# Patient Record
Sex: Female | Born: 1945
Health system: Southern US, Community
[De-identification: ages and names within clinical notes are randomized; demographics above are authoritative.]

## PROBLEM LIST (undated history)

## (undated) DIAGNOSIS — E079 Disorder of thyroid, unspecified: Secondary | ICD-10-CM

## (undated) DIAGNOSIS — E785 Hyperlipidemia, unspecified: Secondary | ICD-10-CM

## (undated) HISTORY — PX: APPENDECTOMY: SHX54

## (undated) HISTORY — DX: Hyperlipidemia, unspecified: E78.5

## (undated) HISTORY — DX: Disorder of thyroid, unspecified: E07.9

---

## 2004-10-20 ENCOUNTER — Ambulatory Visit (HOSPITAL_COMMUNITY): Admission: RE | Admit: 2004-10-20 | Discharge: 2004-10-20 | Payer: Self-pay | Admitting: Internal Medicine

## 2005-02-06 ENCOUNTER — Encounter: Admission: RE | Admit: 2005-02-06 | Discharge: 2005-05-07 | Payer: Self-pay | Admitting: Internal Medicine

## 2005-12-21 ENCOUNTER — Encounter: Admission: RE | Admit: 2005-12-21 | Discharge: 2005-12-21 | Payer: Self-pay | Admitting: Internal Medicine

## 2009-01-15 ENCOUNTER — Encounter: Admission: RE | Admit: 2009-01-15 | Discharge: 2009-01-15 | Payer: Self-pay | Admitting: Internal Medicine

## 2009-05-05 ENCOUNTER — Encounter: Admission: RE | Admit: 2009-05-05 | Discharge: 2009-05-05 | Payer: Self-pay | Admitting: Internal Medicine

## 2009-11-09 ENCOUNTER — Other Ambulatory Visit: Admission: RE | Admit: 2009-11-09 | Discharge: 2009-11-09 | Payer: Self-pay | Admitting: Internal Medicine

## 2009-11-09 ENCOUNTER — Ambulatory Visit: Payer: Self-pay | Admitting: Internal Medicine

## 2009-11-09 LAB — HM PAP SMEAR

## 2010-06-21 ENCOUNTER — Ambulatory Visit: Payer: Self-pay | Admitting: Internal Medicine

## 2011-01-11 IMAGING — CR DG SACRUM/COCCYX 2+V
4 series · 4 of 4 positions shown · non-contrast
Comparison: None.

CLINICAL DATA: Fall.  Low back and left hip pain.

SACRUM AND COCCYX - 2+ VIEW

[view not recorded (1 of 4)]
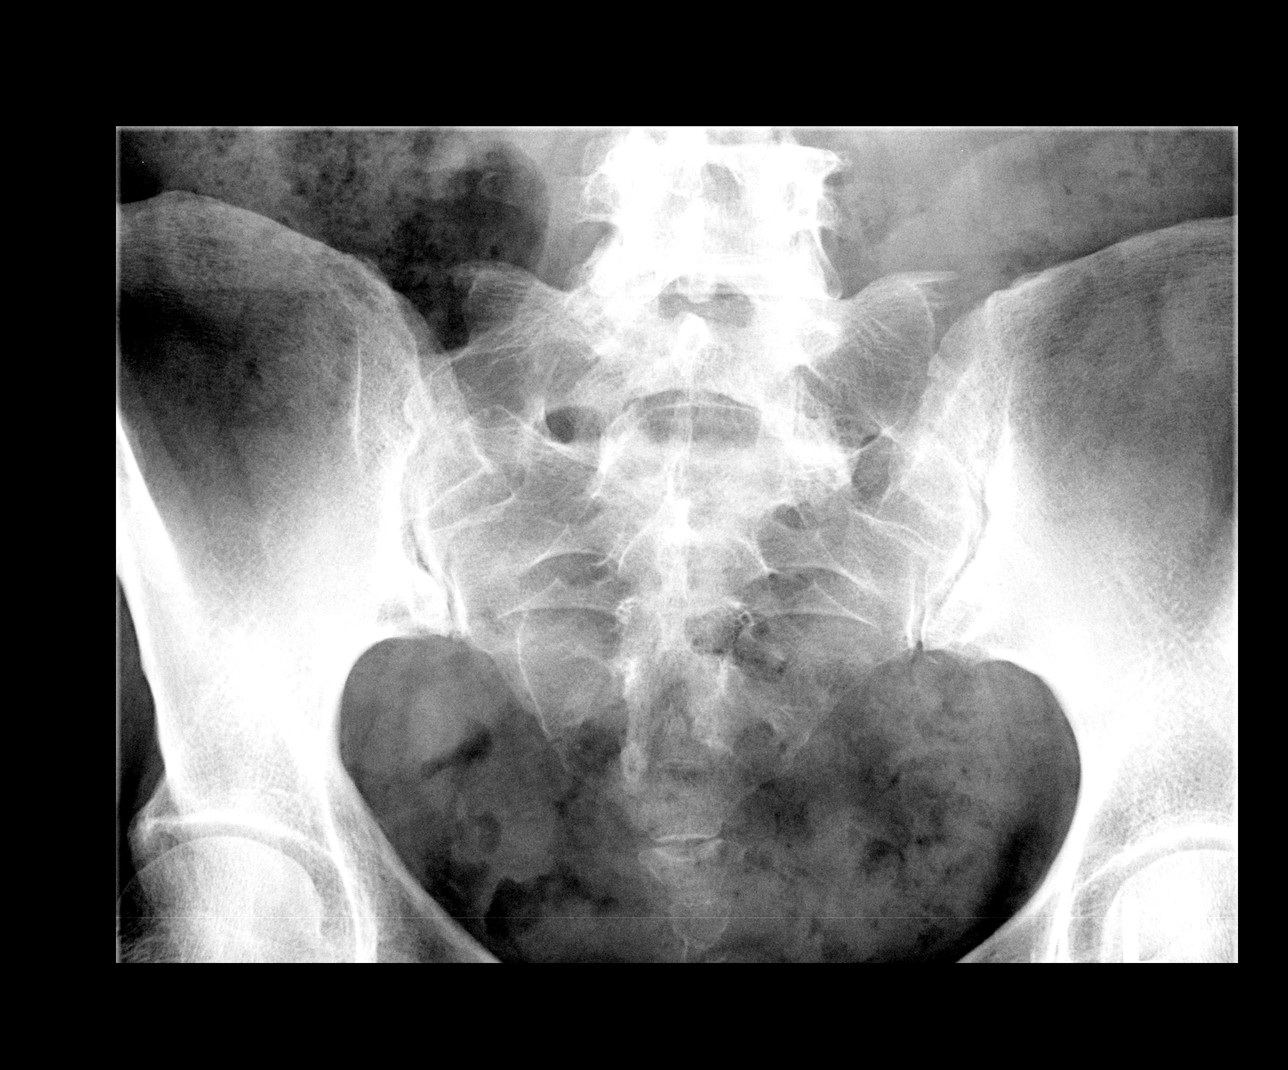

[view not recorded (2 of 4)]
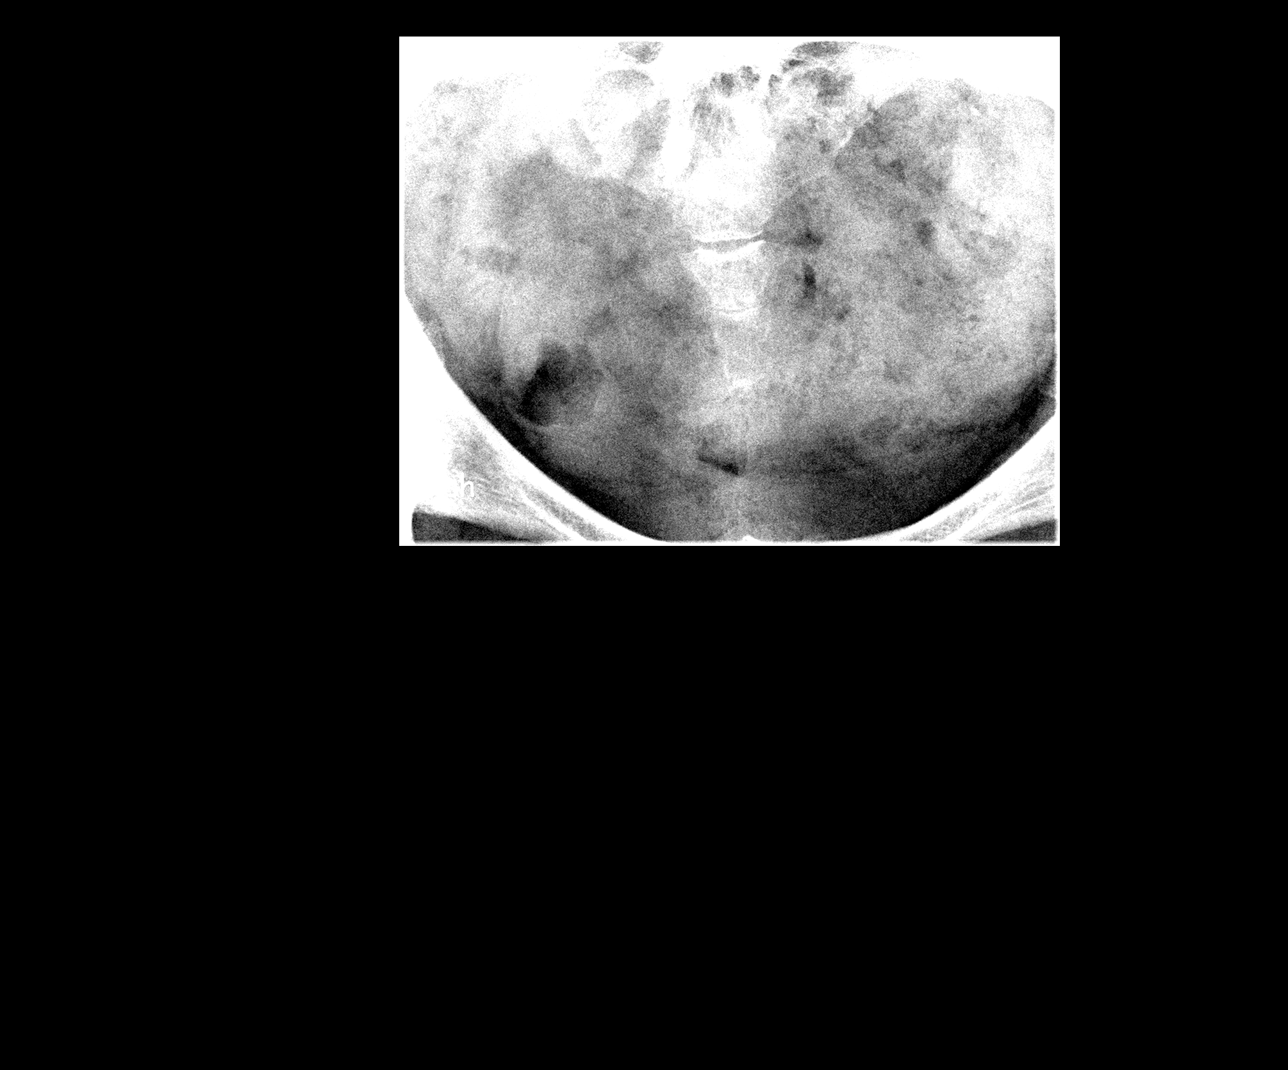

[view not recorded (3 of 4)]
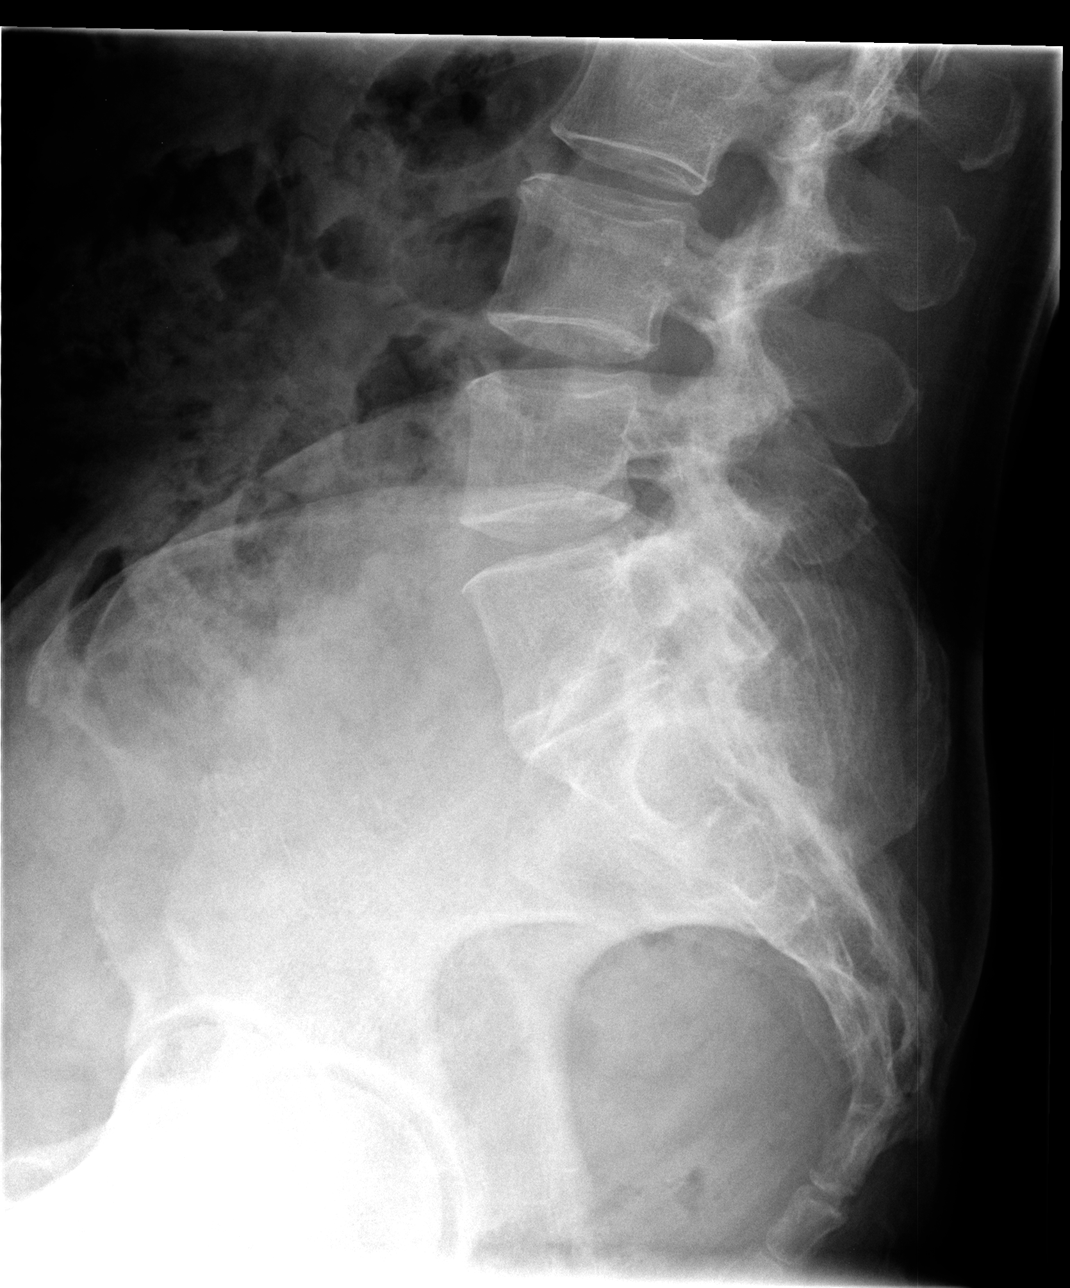

[view not recorded (4 of 4)]
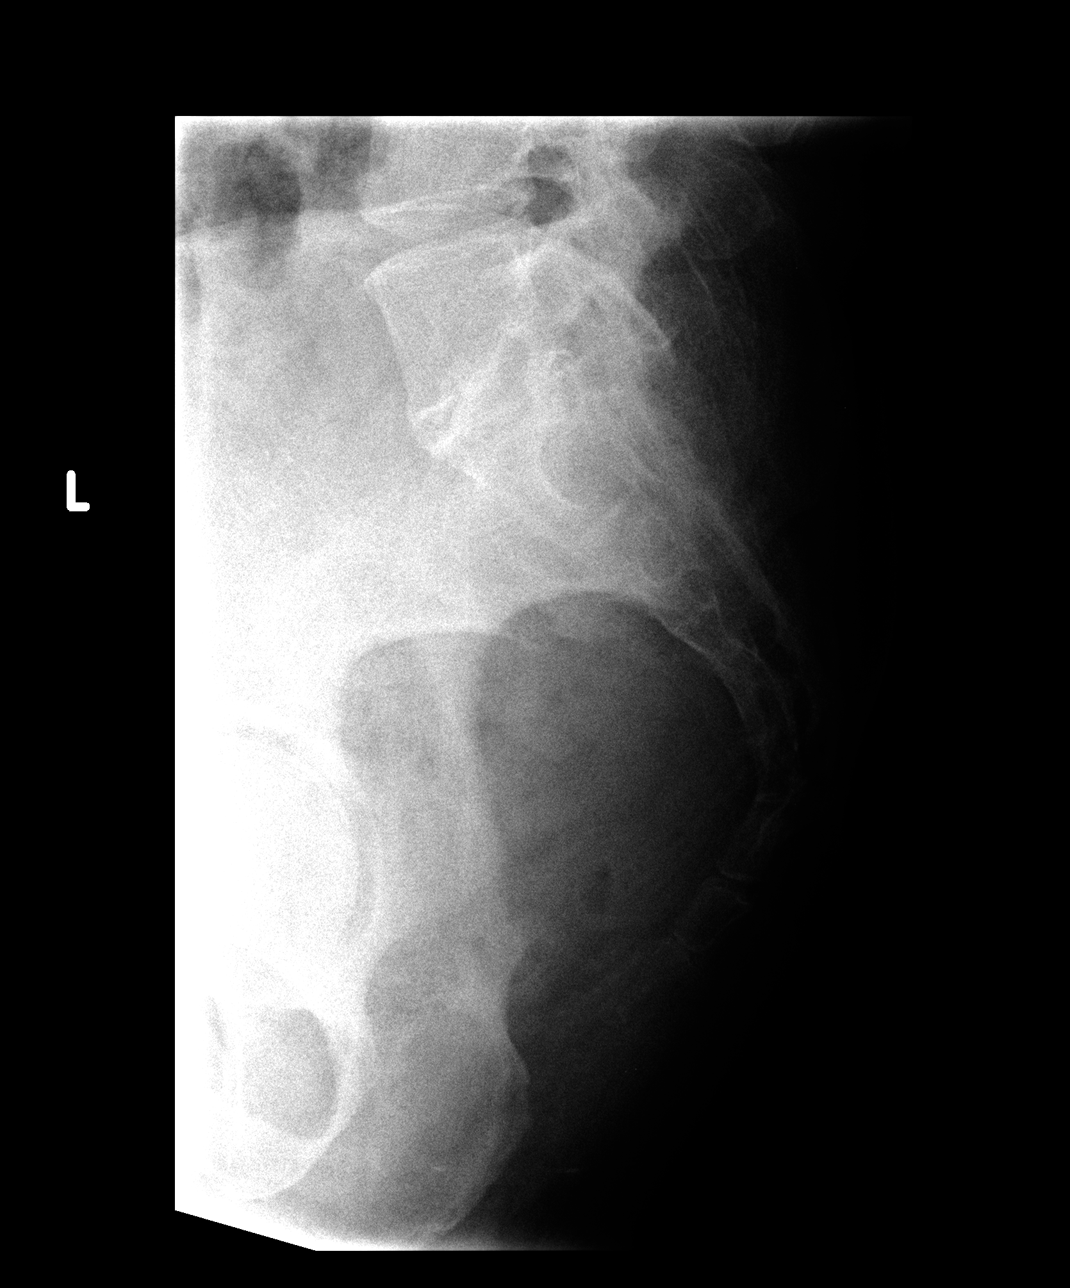

[4 of 4 positions shown; findings below may reference images not displayed]

FINDINGS: No evidence for acute fracture of the sacrum.  Arcuate
lines are preserved.  The SI joints are normal.
IMPRESSION: No acute bony abnormality.

## 2011-02-18 LAB — HM MAMMOGRAPHY

## 2011-03-29 ENCOUNTER — Other Ambulatory Visit: Payer: Self-pay | Admitting: Internal Medicine

## 2011-06-02 ENCOUNTER — Other Ambulatory Visit: Payer: Self-pay | Admitting: Internal Medicine

## 2011-06-30 ENCOUNTER — Encounter: Payer: Self-pay | Admitting: Internal Medicine

## 2011-07-06 ENCOUNTER — Ambulatory Visit (INDEPENDENT_AMBULATORY_CARE_PROVIDER_SITE_OTHER): Payer: BC Managed Care – PPO | Admitting: Internal Medicine

## 2011-07-06 ENCOUNTER — Encounter: Payer: Self-pay | Admitting: Internal Medicine

## 2011-07-06 ENCOUNTER — Other Ambulatory Visit: Payer: BC Managed Care – PPO | Admitting: Internal Medicine

## 2011-07-06 VITALS — BP 128/70 | HR 74 | Temp 99.0°F | Ht 62.5 in | Wt 150.0 lb

## 2011-07-06 DIAGNOSIS — Z0000 Encounter for general adult medical examination without abnormal findings: Secondary | ICD-10-CM

## 2011-07-06 DIAGNOSIS — E785 Hyperlipidemia, unspecified: Secondary | ICD-10-CM

## 2011-07-06 DIAGNOSIS — E119 Type 2 diabetes mellitus without complications: Secondary | ICD-10-CM

## 2011-07-06 LAB — CBC WITH DIFFERENTIAL/PLATELET
Basophils Absolute: 0 10*3/uL (ref 0.0–0.1)
Basophils Relative: 0 % (ref 0–1)
Eosinophils Absolute: 0.2 10*3/uL (ref 0.0–0.7)
HCT: 41.9 % (ref 36.0–46.0)
Hemoglobin: 13.4 g/dL (ref 12.0–15.0)
Lymphs Abs: 2.4 10*3/uL (ref 0.7–4.0)
MCH: 30.6 pg (ref 26.0–34.0)
MCHC: 32 g/dL (ref 30.0–36.0)
MCV: 95.7 fL (ref 78.0–100.0)
Monocytes Absolute: 0.6 10*3/uL (ref 0.1–1.0)
Monocytes Relative: 9 % (ref 3–12)
Neutro Abs: 3.3 10*3/uL (ref 1.7–7.7)
Platelets: 292 10*3/uL (ref 150–400)
RDW: 13.3 % (ref 11.5–15.5)
WBC: 6.5 10*3/uL (ref 4.0–10.5)

## 2011-07-06 LAB — POCT URINALYSIS DIPSTICK
Blood, UA: NEGATIVE
Glucose, UA: NEGATIVE
Leukocytes, UA: NEGATIVE
Protein, UA: NEGATIVE
Urobilinogen, UA: NEGATIVE
pH, UA: 6

## 2011-07-06 LAB — TSH: TSH: 5.056 u[IU]/mL — ABNORMAL HIGH (ref 0.350–4.500)

## 2011-07-07 LAB — LIPID PANEL
Cholesterol: 189 mg/dL (ref 0–200)
HDL: 56 mg/dL (ref >39)
LDL Cholesterol: 102 mg/dL — ABNORMAL HIGH (ref 0–99)
Total CHOL/HDL Ratio: 3.4 Ratio
VLDL: 31 mg/dL (ref 0–40)

## 2011-07-07 LAB — HEPATIC FUNCTION PANEL
ALT: 8 U/L (ref 0–35)
AST: 23 U/L (ref 0–37)
Albumin: 4.8 g/dL (ref 3.5–5.2)
Bilirubin, Direct: 0.2 mg/dL (ref 0.0–0.3)
Indirect Bilirubin: 0.8 mg/dL (ref 0.0–0.9)
Total Bilirubin: 1 mg/dL (ref 0.3–1.2)

## 2011-07-07 LAB — BASIC METABOLIC PANEL
BUN: 21 mg/dL (ref 6–23)
CO2: 24 mEq/L (ref 19–32)
Calcium: 9.7 mg/dL (ref 8.4–10.5)
Chloride: 102 mEq/L (ref 96–112)
Creat: 0.78 mg/dL (ref 0.50–1.10)
Glucose, Bld: 111 mg/dL — ABNORMAL HIGH (ref 70–99)
Potassium: 4.5 mEq/L (ref 3.5–5.3)
Sodium: 138 mEq/L (ref 135–145)

## 2011-07-07 LAB — HEMOGLOBIN A1C
Hgb A1c MFr Bld: 6.5 % — ABNORMAL HIGH (ref <5.7)
Mean Plasma Glucose: 140 mg/dL — ABNORMAL HIGH (ref <117)

## 2011-07-07 LAB — VITAMIN D 25 HYDROXY (VIT D DEFICIENCY, FRACTURES): Vit D, 25-Hydroxy: 57 ng/mL (ref 30–89)

## 2011-07-23 ENCOUNTER — Other Ambulatory Visit: Payer: Self-pay | Admitting: Internal Medicine

## 2011-07-27 DIAGNOSIS — E785 Hyperlipidemia, unspecified: Secondary | ICD-10-CM | POA: Insufficient documentation

## 2011-07-27 DIAGNOSIS — E119 Type 2 diabetes mellitus without complications: Secondary | ICD-10-CM | POA: Insufficient documentation

## 2011-07-27 NOTE — Progress Notes (Signed)
  Subjective:    Patient ID: Paige Ali, female    DOB: 04-17-1946, 65 y.o.   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 History of adult onset diabetes for which she takes metformin. Also takes Lipitor for hyperlipidemia. Says she became diabetic proximally 2008. Old records indicate diabetes began in 2006. Has been in this country for approximately 10 years. Has a college degree and is a widow. Does not smoke. 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 She has a daughter. Social alcohol consumption.  Mother  with history of diabetes. Patient walks and has been riding a bicycle for exercise.     Review of Systems  Constitutional: Negative.   HENT: Negative.   Eyes: Negative.   Respiratory: Negative.   Cardiovascular: Negative.   Gastrointestinal: Negative.   Genitourinary: Negative.   Musculoskeletal: Negative.   Neurological: Negative.   Hematological: Negative.   Psychiatric/Behavioral: Negative.        Objective:   Physical Exam  Vitals reviewed. Constitutional: She is oriented to person, place, and time. She appears well-nourished.  HENT:  Head: Normocephalic and atraumatic.  Right Ear: External ear normal.  Left Ear: External ear normal.  Mouth/Throat: Oropharynx is clear and moist.  Eyes: Conjunctivae and EOM are normal. Pupils are equal, round, and reactive to light. No scleral icterus.  Neck: Neck supple. No JVD present. No thyromegaly present.  Cardiovascular: Normal rate, regular rhythm and normal heart sounds.   No murmur heard. Pulmonary/Chest: Effort normal and breath sounds normal. She has no wheezes.  Abdominal: Soft. Bowel sounds are normal. She exhibits no distension and no mass. There is no tenderness. There is no rebound.  Musculoskeletal: She exhibits no edema.  Lymphadenopathy:    She has no cervical adenopathy.  Neurological: She is alert and oriented to person, place, and time. She has normal reflexes.   Skin: Skin is warm and dry. No rash noted.  Psychiatric: She has a normal mood and affect.          Assessment & Plan:  Diabetes mellitus-well controlled with diet exercise and metformin  Hyperlipidemia-well controlled on statin medication  Patient is to return in one year or as needed.  Reminded about diabetic eye exam. Had mammogram April 2012. Mentioned screening colonoscopy to her.

## 2011-09-01 ENCOUNTER — Other Ambulatory Visit: Payer: Self-pay | Admitting: Internal Medicine

## 2011-09-08 ENCOUNTER — Other Ambulatory Visit: Payer: BC Managed Care – PPO | Admitting: Internal Medicine

## 2011-09-08 DIAGNOSIS — E039 Hypothyroidism, unspecified: Secondary | ICD-10-CM

## 2011-09-08 LAB — TSH: TSH: 3.076 u[IU]/mL (ref 0.350–4.500)

## 2011-09-11 ENCOUNTER — Other Ambulatory Visit: Payer: Self-pay

## 2011-09-11 MED ORDER — LEVOTHYROXINE SODIUM 50 MCG PO TABS: 50.0000 ug | ORAL_TABLET | Freq: Every day | ORAL | Status: DC

## 2011-09-28 ENCOUNTER — Other Ambulatory Visit: Payer: Self-pay | Admitting: Internal Medicine

## 2011-11-01 ENCOUNTER — Other Ambulatory Visit: Payer: Self-pay | Admitting: Internal Medicine

## 2011-11-30 ENCOUNTER — Other Ambulatory Visit: Payer: Self-pay

## 2011-11-30 MED ORDER — ATORVASTATIN CALCIUM 40 MG PO TABS: 40.0000 mg | ORAL_TABLET | Freq: Every day | ORAL | Status: DC

## 2011-11-30 MED ORDER — METFORMIN HCL 500 MG PO TABS: 500.0000 mg | ORAL_TABLET | Freq: Two times a day (BID) | ORAL | Status: DC

## 2012-03-08 ENCOUNTER — Other Ambulatory Visit: Payer: BC Managed Care – PPO | Admitting: Internal Medicine

## 2012-03-08 DIAGNOSIS — E039 Hypothyroidism, unspecified: Secondary | ICD-10-CM

## 2012-03-08 LAB — TSH: TSH: 2.557 u[IU]/mL (ref 0.350–4.500)

## 2012-03-08 LAB — HEMOGLOBIN A1C: Mean Plasma Glucose: 134 mg/dL — ABNORMAL HIGH (ref <117)

## 2012-03-16 ENCOUNTER — Other Ambulatory Visit: Payer: Self-pay | Admitting: Internal Medicine

## 2012-04-11 ENCOUNTER — Other Ambulatory Visit: Payer: Self-pay | Admitting: Internal Medicine

## 2012-06-13 ENCOUNTER — Other Ambulatory Visit: Payer: Self-pay

## 2012-06-13 MED ORDER — METFORMIN HCL 500 MG PO TABS: 500.0000 mg | ORAL_TABLET | Freq: Two times a day (BID) | ORAL | Status: DC

## 2012-06-13 MED ORDER — LEVOTHYROXINE SODIUM 50 MCG PO TABS: 50.0000 ug | ORAL_TABLET | Freq: Every day | ORAL | Status: DC

## 2012-09-12 ENCOUNTER — Other Ambulatory Visit: Payer: BC Managed Care – PPO | Admitting: Internal Medicine

## 2012-09-12 DIAGNOSIS — E119 Type 2 diabetes mellitus without complications: Secondary | ICD-10-CM

## 2012-09-12 DIAGNOSIS — Z0000 Encounter for general adult medical examination without abnormal findings: Secondary | ICD-10-CM

## 2012-09-12 DIAGNOSIS — E785 Hyperlipidemia, unspecified: Secondary | ICD-10-CM

## 2012-09-12 DIAGNOSIS — E039 Hypothyroidism, unspecified: Secondary | ICD-10-CM

## 2012-09-12 LAB — COMPREHENSIVE METABOLIC PANEL
AST: 21 U/L (ref 0–37)
BUN: 15 mg/dL (ref 6–23)
CO2: 25 mEq/L (ref 19–32)
Calcium: 9.9 mg/dL (ref 8.4–10.5)
Chloride: 100 mEq/L (ref 96–112)
Creat: 0.82 mg/dL (ref 0.50–1.10)
Glucose, Bld: 104 mg/dL — ABNORMAL HIGH (ref 70–99)
Potassium: 4.1 mEq/L (ref 3.5–5.3)
Sodium: 138 mEq/L (ref 135–145)
Total Protein: 7.6 g/dL (ref 6.0–8.3)

## 2012-09-12 LAB — CBC WITH DIFFERENTIAL/PLATELET
Basophils Absolute: 0 10*3/uL (ref 0.0–0.1)
Basophils Relative: 0 % (ref 0–1)
Eosinophils Absolute: 0.2 10*3/uL (ref 0.0–0.7)
Eosinophils Relative: 3 % (ref 0–5)
HCT: 39.7 % (ref 36.0–46.0)
Hemoglobin: 13.4 g/dL (ref 12.0–15.0)
Lymphocytes Relative: 41 % (ref 12–46)
MCH: 31.3 pg (ref 26.0–34.0)
MCHC: 33.8 g/dL (ref 30.0–36.0)
MCV: 92.8 fL (ref 78.0–100.0)
Monocytes Absolute: 0.6 10*3/uL (ref 0.1–1.0)
Monocytes Relative: 8 % (ref 3–12)
Neutro Abs: 3.4 10*3/uL (ref 1.7–7.7)
Neutrophils Relative %: 48 % (ref 43–77)
Platelets: 329 10*3/uL (ref 150–400)
RBC: 4.28 MIL/uL (ref 3.87–5.11)
RDW: 13.5 % (ref 11.5–15.5)

## 2012-09-12 LAB — LIPID PANEL
Cholesterol: 197 mg/dL (ref 0–200)
HDL: 59 mg/dL (ref >39)
LDL Cholesterol: 91 mg/dL (ref 0–99)
Total CHOL/HDL Ratio: 3.3 Ratio
Triglycerides: 235 mg/dL — ABNORMAL HIGH (ref <150)

## 2012-09-12 LAB — TSH: TSH: 3.71 u[IU]/mL (ref 0.350–4.500)

## 2012-09-12 LAB — HEMOGLOBIN A1C
Hgb A1c MFr Bld: 6.6 % — ABNORMAL HIGH (ref <5.7)
Mean Plasma Glucose: 143 mg/dL — ABNORMAL HIGH (ref <117)

## 2012-09-13 ENCOUNTER — Other Ambulatory Visit: Payer: BC Managed Care – PPO | Admitting: Internal Medicine

## 2012-09-13 LAB — VITAMIN D 25 HYDROXY (VIT D DEFICIENCY, FRACTURES): Vit D, 25-Hydroxy: 52 ng/mL (ref 30–89)

## 2012-09-16 ENCOUNTER — Encounter: Payer: Self-pay | Admitting: Internal Medicine

## 2012-09-16 ENCOUNTER — Ambulatory Visit (INDEPENDENT_AMBULATORY_CARE_PROVIDER_SITE_OTHER): Payer: BC Managed Care – PPO | Admitting: Internal Medicine

## 2012-09-16 VITALS — BP 138/80 | HR 92 | Temp 99.0°F | Ht 62.0 in | Wt 152.0 lb

## 2012-09-16 DIAGNOSIS — E119 Type 2 diabetes mellitus without complications: Secondary | ICD-10-CM

## 2012-09-16 DIAGNOSIS — Z0000 Encounter for general adult medical examination without abnormal findings: Secondary | ICD-10-CM

## 2012-09-16 DIAGNOSIS — E785 Hyperlipidemia, unspecified: Secondary | ICD-10-CM

## 2012-09-16 LAB — POCT URINALYSIS DIPSTICK
Bilirubin, UA: NEGATIVE
Glucose, UA: NEGATIVE
Ketones, UA: NEGATIVE
Leukocytes, UA: NEGATIVE
Nitrite, UA: NEGATIVE
Protein, UA: NEGATIVE
Spec Grav, UA: 1.01
Urobilinogen, UA: NEGATIVE
pH, UA: 5.5

## 2012-09-18 ENCOUNTER — Other Ambulatory Visit: Payer: Self-pay

## 2012-09-18 MED ORDER — ATORVASTATIN CALCIUM 40 MG PO TABS: 40.0000 mg | ORAL_TABLET | Freq: Every day | ORAL | Status: DC

## 2012-09-19 ENCOUNTER — Ambulatory Visit (INDEPENDENT_AMBULATORY_CARE_PROVIDER_SITE_OTHER): Payer: BC Managed Care – PPO | Admitting: Internal Medicine

## 2012-09-19 DIAGNOSIS — Z23 Encounter for immunization: Secondary | ICD-10-CM

## 2012-09-19 MED ORDER — TETANUS-DIPHTH-ACELL PERTUSSIS 5-2.5-18.5 LF-MCG/0.5 IM SUSP: 0.5000 mL | Freq: Once | INTRAMUSCULAR | Status: DC

## 2012-10-06 ENCOUNTER — Encounter: Payer: Self-pay | Admitting: Internal Medicine

## 2012-10-06 NOTE — Patient Instructions (Addendum)
Continue same medications and return in one year or as needed. Reminded about diabetic eye exam. Get Zostavax vaccine if pharmacy. Consider colonoscopy. Get annual mammogram.

## 2012-10-06 NOTE — Progress Notes (Signed)
  Subjective:    Patient ID: Paige Ali, female    DOB: 04-24-1946, 66 y.o.   MRN: [id number]  HPI 66 year old white female with history of hyperlipidemia for which she takes generic Lipitor and adult onset diabetes for which she takes metformin. Says she became diabetic approximately 2008. Old records indicate diabetes began around 2006. She has been in this country for just over 10 years. She has a college degree and is a widow. Does not smoke. Social alcohol consumption. 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 She has a daughter. She is a native of [place]. She exercises regularly. She rides a bicycle. She also walks.  Family history: Mother with history of diabetes    Review of Systems  Constitutional: Negative.   All other systems reviewed and are negative.       Objective:   Physical Exam  Vitals reviewed. Constitutional: She is oriented to person, place, and time. She appears well-developed and well-nourished. No distress.  HENT:  Head: Normocephalic and atraumatic.  Right Ear: External ear normal.  Left Ear: External ear normal.  Mouth/Throat: Oropharynx is clear and moist. No oropharyngeal exudate.  Eyes: Conjunctivae normal and EOM are normal. Pupils are equal, round, and reactive to light. Right eye exhibits no discharge. Left eye exhibits no discharge. No scleral icterus.  Neck: Neck supple. No JVD present. No thyromegaly present.  Cardiovascular: Normal rate, regular rhythm, normal heart sounds and intact distal pulses.   No murmur heard. Pulmonary/Chest: Breath sounds normal. She has no wheezes. She has no rales.       Breasts normal female  Abdominal: She exhibits no distension and no mass. There is no tenderness. There is no rebound and no guarding.  Musculoskeletal: Normal range of motion. She exhibits no edema.  Lymphadenopathy:    She has no cervical adenopathy.  Neurological: She is alert and oriented to person, place, and time. She has normal reflexes. She  displays normal reflexes. No cranial nerve deficit. Coordination normal.  Skin: Skin is warm and dry. No rash noted. She is not diaphoretic.  Psychiatric: She has a normal mood and affect. Her behavior is normal. Judgment and thought content normal.          Assessment & Plan:  Hyperlipidemia-stable on generic Lipitor  Type 2 diabetes mellitus stable on metformin  Plan: Return in one year or as needed. Continue same medications.

## 2012-11-27 ENCOUNTER — Other Ambulatory Visit: Payer: Self-pay

## 2012-11-27 MED ORDER — METFORMIN HCL 500 MG PO TABS: 500.0000 mg | ORAL_TABLET | Freq: Two times a day (BID) | ORAL | Status: DC

## 2012-11-27 MED ORDER — LEVOTHYROXINE SODIUM 50 MCG PO TABS: 50.0000 ug | ORAL_TABLET | Freq: Every day | ORAL | Status: DC

## 2012-12-04 ENCOUNTER — Other Ambulatory Visit: Payer: Self-pay | Admitting: Internal Medicine

## 2012-12-05 ENCOUNTER — Other Ambulatory Visit: Payer: Self-pay

## 2012-12-05 MED ORDER — GLUCOSE BLOOD VI STRP: ORAL_STRIP | Status: DC

## 2013-02-04 ENCOUNTER — Other Ambulatory Visit: Payer: Self-pay

## 2013-02-04 MED ORDER — GLUCOSE BLOOD VI STRP: ORAL_STRIP | Status: DC

## 2013-06-10 ENCOUNTER — Other Ambulatory Visit: Payer: Medicare Other | Admitting: Internal Medicine

## 2013-06-10 DIAGNOSIS — E785 Hyperlipidemia, unspecified: Secondary | ICD-10-CM

## 2013-06-10 DIAGNOSIS — E039 Hypothyroidism, unspecified: Secondary | ICD-10-CM

## 2013-06-10 DIAGNOSIS — E119 Type 2 diabetes mellitus without complications: Secondary | ICD-10-CM

## 2013-06-10 DIAGNOSIS — Z79899 Other long term (current) drug therapy: Secondary | ICD-10-CM

## 2013-06-10 LAB — HEPATIC FUNCTION PANEL
ALT: <8 U/L (ref 0–35)
AST: 18 U/L (ref 0–37)
Alkaline Phosphatase: 52 U/L (ref 39–117)
Bilirubin, Direct: 0.1 mg/dL (ref 0.0–0.3)
Indirect Bilirubin: 0.8 mg/dL (ref 0.0–0.9)

## 2013-06-10 LAB — TSH: TSH: 2.512 u[IU]/mL (ref 0.350–4.500)

## 2013-06-10 LAB — HEMOGLOBIN A1C: Hgb A1c MFr Bld: 6.3 % — ABNORMAL HIGH (ref <5.7)

## 2013-06-10 LAB — LIPID PANEL: Cholesterol: 204 mg/dL — ABNORMAL HIGH (ref 0–200)

## 2013-06-12 ENCOUNTER — Encounter: Payer: Self-pay | Admitting: Internal Medicine

## 2013-06-12 ENCOUNTER — Ambulatory Visit (INDEPENDENT_AMBULATORY_CARE_PROVIDER_SITE_OTHER): Payer: Medicare Other | Admitting: Internal Medicine

## 2013-06-12 VITALS — BP 120/80 | HR 84 | Temp 98.1°F | Wt 149.0 lb

## 2013-06-12 DIAGNOSIS — E039 Hypothyroidism, unspecified: Secondary | ICD-10-CM

## 2013-06-12 DIAGNOSIS — E119 Type 2 diabetes mellitus without complications: Secondary | ICD-10-CM

## 2013-06-12 DIAGNOSIS — E785 Hyperlipidemia, unspecified: Secondary | ICD-10-CM

## 2013-06-12 MED ORDER — LEVOTHYROXINE SODIUM 50 MCG PO TABS: 50.0000 ug | ORAL_TABLET | Freq: Every day | ORAL | Status: DC

## 2013-06-12 NOTE — Progress Notes (Signed)
  Subjective:    Patient ID: Paige Ali, female    DOB: 06/30/1946, 67 y.o.   MRN: [id number]  HPI 67 year old female in today for followup of hyperlipidemia, type 2 diabetes mellitus, hypothyroidism. Her father died at age 77 last month of complications of old age. Mother still living. Her daughter got married recently in [place]. She continues to operate the coffee shop. She swims a lot and sometimes rides her bike. Hemoglobin A1c is 6.3% and previously in November 2013 was 6.6%. TSH stable at 2.512 on Synthroid 0.05 mg daily. Think she would like to try generic thyroid replacement. Gave her written prescription for levothyroxin 0.05 mg with refills.  With regard to hyperlipidemia, she is on statin therapy. Total cholesterol has increased slightly from 197-204. Triglycerides have decreased from 235-209. LDL cholesterol has increased slightly from 91-111. Continue same treatment. She feels well and has no other complaints today.    Review of Systems     Objective:   Physical Exam Skin is warm and dry. Nodes none. Neck: is supple without JVD, thyromegaly , or carotid bruits. Chest: clear to auscultation. Cardiac exam: regular rate and rhythm, normal S1 and S2. Extremities: without edema. See diabetic foot exam        Assessment & Plan:  Controlled type 2 diabetes-no change in medication  Hypothyroidism-stable on thyroid replacement. Change to generic thyroid medication and reassess in spring 2015  Hyperlipidemia-stable no change in medication  Plan: Return Spring 2015 for physical examination.

## 2013-06-12 NOTE — Patient Instructions (Addendum)
Changing from Synthroid to levothyroxin same dose 0.05 mg daily. Continue other medications as previously prescribed. Return spring 2015 for physical exam.

## 2013-06-18 ENCOUNTER — Other Ambulatory Visit: Payer: Self-pay | Admitting: Internal Medicine

## 2013-09-30 ENCOUNTER — Other Ambulatory Visit: Payer: Self-pay | Admitting: Internal Medicine

## 2013-11-12 LAB — HM DIABETES EYE EXAM

## 2013-12-11 ENCOUNTER — Other Ambulatory Visit: Payer: Self-pay

## 2013-12-11 DIAGNOSIS — E039 Hypothyroidism, unspecified: Secondary | ICD-10-CM

## 2013-12-11 MED ORDER — ATORVASTATIN CALCIUM 40 MG PO TABS: 40.0000 mg | ORAL_TABLET | Freq: Every day | ORAL | Status: DC

## 2013-12-11 MED ORDER — METFORMIN HCL 500 MG PO TABS: 500.0000 mg | ORAL_TABLET | Freq: Two times a day (BID) | ORAL | Status: DC

## 2013-12-11 MED ORDER — LEVOTHYROXINE SODIUM 50 MCG PO TABS: 50.0000 ug | ORAL_TABLET | Freq: Every day | ORAL | Status: DC

## 2013-12-11 MED ORDER — GLUCOSE BLOOD VI STRP: ORAL_STRIP | Status: DC

## 2014-02-20 ENCOUNTER — Other Ambulatory Visit: Payer: Medicare HMO | Admitting: Internal Medicine

## 2014-02-20 DIAGNOSIS — Z79899 Other long term (current) drug therapy: Secondary | ICD-10-CM

## 2014-02-20 DIAGNOSIS — E039 Hypothyroidism, unspecified: Secondary | ICD-10-CM

## 2014-02-20 DIAGNOSIS — Z130 Encounter for screening for diseases of the blood and blood-forming organs and certain disorders involving the immune mechanism: Secondary | ICD-10-CM

## 2014-02-20 DIAGNOSIS — E119 Type 2 diabetes mellitus without complications: Secondary | ICD-10-CM

## 2014-02-20 DIAGNOSIS — E785 Hyperlipidemia, unspecified: Secondary | ICD-10-CM

## 2014-02-20 LAB — CBC WITH DIFFERENTIAL/PLATELET
BASOS ABS: 0.1 10*3/uL (ref 0.0–0.1)
Basophils Relative: 1 % (ref 0–1)
Eosinophils Absolute: 0.3 10*3/uL (ref 0.0–0.7)
Eosinophils Relative: 4 % (ref 0–5)
HEMATOCRIT: 38.4 % (ref 36.0–46.0)
Hemoglobin: 13 g/dL (ref 12.0–15.0)
LYMPHS PCT: 43 % (ref 12–46)
Lymphs Abs: 2.7 10*3/uL (ref 0.7–4.0)
MCH: 30.7 pg (ref 26.0–34.0)
MCHC: 33.9 g/dL (ref 30.0–36.0)
MCV: 90.8 fL (ref 78.0–100.0)
Monocytes Absolute: 0.4 10*3/uL (ref 0.1–1.0)
Monocytes Relative: 7 % (ref 3–12)
NEUTROS ABS: 2.8 10*3/uL (ref 1.7–7.7)
Neutrophils Relative %: 45 % (ref 43–77)
PLATELETS: 288 10*3/uL (ref 150–400)
RBC: 4.23 MIL/uL (ref 3.87–5.11)
RDW: 13.7 % (ref 11.5–15.5)
WBC: 6.3 10*3/uL (ref 4.0–10.5)

## 2014-02-21 LAB — COMPREHENSIVE METABOLIC PANEL
ALBUMIN: 4.7 g/dL (ref 3.5–5.2)
ALT: 8 U/L (ref 0–35)
AST: 19 U/L (ref 0–37)
Alkaline Phosphatase: 50 U/L (ref 39–117)
BUN: 16 mg/dL (ref 6–23)
CALCIUM: 9.3 mg/dL (ref 8.4–10.5)
CHLORIDE: 103 meq/L (ref 96–112)
CO2: 27 mEq/L (ref 19–32)
Creat: 0.76 mg/dL (ref 0.50–1.10)
Glucose, Bld: 117 mg/dL — ABNORMAL HIGH (ref 70–99)
POTASSIUM: 4 meq/L (ref 3.5–5.3)
SODIUM: 139 meq/L (ref 135–145)
Total Bilirubin: 1 mg/dL (ref 0.2–1.2)
Total Protein: 7.2 g/dL (ref 6.0–8.3)

## 2014-02-21 LAB — HEMOGLOBIN A1C
Hgb A1c MFr Bld: 6.6 % — ABNORMAL HIGH (ref <5.7)
Mean Plasma Glucose: 143 mg/dL — ABNORMAL HIGH (ref <117)

## 2014-02-21 LAB — LIPID PANEL
Cholesterol: 193 mg/dL (ref 0–200)
HDL: 59 mg/dL (ref >39)
LDL Cholesterol: 101 mg/dL — ABNORMAL HIGH (ref 0–99)
Total CHOL/HDL Ratio: 3.3 Ratio
Triglycerides: 164 mg/dL — ABNORMAL HIGH (ref <150)
VLDL: 33 mg/dL (ref 0–40)

## 2014-02-21 LAB — TSH: TSH: 3.263 u[IU]/mL (ref 0.350–4.500)

## 2014-02-23 ENCOUNTER — Other Ambulatory Visit (HOSPITAL_COMMUNITY)
Admission: RE | Admit: 2014-02-23 | Discharge: 2014-02-23 | Disposition: A | Discharge status: Home / Self Care | Payer: Medicare PPO | Source: Ambulatory Visit | Attending: Internal Medicine | Admitting: Internal Medicine

## 2014-02-23 ENCOUNTER — Encounter: Payer: Self-pay | Admitting: Internal Medicine

## 2014-02-23 ENCOUNTER — Ambulatory Visit (INDEPENDENT_AMBULATORY_CARE_PROVIDER_SITE_OTHER): Payer: Medicare HMO | Admitting: Internal Medicine

## 2014-02-23 VITALS — BP 110/80 | HR 96 | Temp 98.4°F | Ht 62.25 in | Wt 154.0 lb

## 2014-02-23 DIAGNOSIS — Z124 Encounter for screening for malignant neoplasm of cervix: Secondary | ICD-10-CM | POA: Insufficient documentation

## 2014-02-23 DIAGNOSIS — E119 Type 2 diabetes mellitus without complications: Secondary | ICD-10-CM

## 2014-02-23 DIAGNOSIS — Z0000 Encounter for general adult medical examination without abnormal findings: Secondary | ICD-10-CM

## 2014-02-23 DIAGNOSIS — E785 Hyperlipidemia, unspecified: Secondary | ICD-10-CM

## 2014-02-23 LAB — POCT URINALYSIS DIPSTICK
Bilirubin, UA: NEGATIVE
GLUCOSE UA: NEGATIVE
KETONES UA: NEGATIVE
Leukocytes, UA: NEGATIVE
Nitrite, UA: NEGATIVE
Protein, UA: NEGATIVE
RBC UA: NEGATIVE
UROBILINOGEN UA: NEGATIVE
pH, UA: 5.5

## 2014-02-23 NOTE — Progress Notes (Signed)
Subjective:    Patient ID: Paige Ali, female    DOB: 07/29/1946, 68 y.o.   MRN: [id number]  HPI 68 year old female in today for health maintenance exam and evaluation of medical issues. This is welcome to Medicare exam. History of adult onset diabetes for which she takes metformin. Takes Lipitor for hyperlipidemia. Says she became diabetic approximately 2008 but no records indicate diabetes began in 2006. Has been in this country now about 13 years. She has a college degree and is a widow. She does not smoke. She operates a coffee shop downtown.  She has a daughter that lives in [place].  She does not smoke. Social alcohol consumption. She exercises regularly at [facility] including of the and swimming. She also rides her bicycle.  Father died pass away June  Nearly 66 years old leg infection lived in [place], Mother age 32, hx diabetes, living. 2 bothers and 2 sisters, healthy.  Review of Systems  Constitutional: Negative.   All other systems reviewed and are negative.      Objective:   Physical Exam  Vitals reviewed. Constitutional: She is oriented to person, place, and time. She appears well-developed and well-nourished. No distress.  HENT:  Head: Normocephalic and atraumatic.  Right Ear: External ear normal.  Left Ear: External ear normal.  Eyes: Conjunctivae and EOM are normal. Pupils are equal, round, and reactive to light. Left eye exhibits no discharge. No scleral icterus.  Neck: Neck supple. No JVD present. No thyromegaly present.  Cardiovascular: Normal rate, regular rhythm, normal heart sounds and intact distal pulses.   No murmur heard. Pulmonary/Chest: Effort normal and breath sounds normal. No respiratory distress. She has no wheezes. She has no rales. She exhibits no tenderness.  Abdominal: Soft. Bowel sounds are normal. She exhibits no distension and no mass. There is no tenderness. There is no rebound and no guarding.  Genitourinary:  Pap taken. Bimanual normal    Musculoskeletal: Normal range of motion. She exhibits no edema.  Lymphadenopathy:    She has no cervical adenopathy.  Neurological: She is alert and oriented to person, place, and time. She has normal reflexes. She displays normal reflexes. No cranial nerve deficit. Coordination normal.  Skin: Skin is warm and dry. No rash noted. She is not diaphoretic.  Psychiatric: She has a normal mood and affect. Her behavior is normal. Judgment and thought content normal.          Assessment & Plan:  Controlled type 2 diabetes  Hyperlipidemia  Plan: Return in one year or as needed. Recommend  annual diabetic eye exam. Needs to have mammogram in the near future. Have mentioned screening colonoscopy.     Subjective:   Patient presents for Medicare Annual/Subsequent preventive examination.  Review Past Medical/Family/Social: see above   Risk Factors  Current exercise habits: Swim at [employer], ping pong, Rides bike, plays chess Dietary issues discussed:   Cardiac risk factors:  Depression Screen  (Note: if answer to either of the following is "Yes", a more complete depression screening is indicated)   Over the past two weeks, have you felt down, depressed or hopeless? No  Over the past two weeks, have you felt little interest or pleasure in doing things? No Have you lost interest or pleasure in daily life? No Do you often feel hopeless? No Do you cry easily over simple problems? No   Activities of Daily Living  In your present state of health, do you have any difficulty performing the following activities?:  Driving? No  Managing money? No  Feeding yourself? No  Getting from bed to chair? No  Climbing a flight of stairs? No  Preparing food and eating?: No  Bathing or showering? No  Getting dressed: No  Getting to the toilet? No  Using the toilet:No  Moving around from place to place: No  In the past year have you fallen or had a near fall?:No  Are you sexually active? No  Do  you have more than one partner? No   Hearing Difficulties: No  Do you often ask people to speak up or repeat themselves? No  Do you experience ringing or noises in your ears? No  Do you have difficulty understanding soft or whispered voices? No  Do you feel that you have a problem with memory? No Do you often misplace items? No    Home Safety:  Do you have a smoke alarm at your residence? Yes Do you have grab bars in the bathroom? no Do you have throw rugs in your house? Yes but has rubber underneath rug   Cognitive Testing  Alert? Yes Normal Appearance?Yes  Oriented to person? Yes Place? Yes  Time? Yes  Recall of three objects? Yes  Can perform simple calculations? Yes  Displays appropriate judgment?Yes  Can read the correct time from a watch face?Yes   List the Names of Other Physician/Practitioners you currently use:  See referral list for the physicians patient is currently seeing. Eye physician    Review of Systems: see EPIC   Objective:     General appearance: Appears stated age  Head: Normocephalic, without obvious abnormality, atraumatic  Eyes: conj clear, EOMi PEERLA  Ears: normal TM's and external ear canals both ears  Nose: Nares normal. Septum midline. Mucosa normal. No drainage or sinus tenderness.  Throat: lips, mucosa, and tongue normal; teeth and gums normal  Neck: no adenopathy, no carotid bruit, no JVD, supple, symmetrical, trachea midline and thyroid not enlarged, symmetric, no tenderness/mass/nodules  No CVA tenderness.  Lungs: clear to auscultation bilaterally  Breasts: normal appearance, no masses or tenderness Heart: regular rate and rhythm, S1, S2 normal, no murmur, click, rub or gallop  Abdomen: soft, non-tender; bowel sounds normal; no masses, no organomegaly  Musculoskeletal: ROM normal in all joints, no crepitus, no deformity, Normal muscle strengthen. Back  is symmetric, no curvature. Skin: Skin color, texture, turgor normal. No rashes or  lesions  Lymph nodes: Cervical, supraclavicular, and axillary nodes normal.  Neurologic: CN 2 -12 Normal, Normal symmetric reflexes. Normal coordination and gait  Psych: Alert & Oriented x 3, Mood appear stable.    Assessment:    Annual wellness medicare exam   Plan:    During the course of the visit the patient was educated and counseled about appropriate screening and preventive services including:   Annual mammogram  Pap smear done     Patient Instructions (the written plan) was given to the patient.  Medicare Attestation  I have personally reviewed:  The patient's medical and social history  Their use of alcohol, tobacco or illicit drugs  Their current medications and supplements  The patient's functional ability including ADLs,fall risks, home safety risks, cognitive, and hearing and visual impairment  Diet and physical activities  Evidence for depression or mood disorders  The patient's weight, height, BMI, and visual acuity have been recorded in the chart. I have made referrals, counseling, and provided education to the patient based on review of the above and I have provided the patient  with a written personalized care plan for preventive services.

## 2014-02-26 NOTE — Patient Instructions (Signed)
Return in one year or as needed. Consider screening colonoscopy. Have mammogram.

## 2014-03-24 ENCOUNTER — Other Ambulatory Visit: Payer: Self-pay | Admitting: Dermatology

## 2014-04-06 ENCOUNTER — Other Ambulatory Visit: Payer: Self-pay

## 2014-05-18 ENCOUNTER — Other Ambulatory Visit: Payer: Self-pay

## 2014-05-18 MED ORDER — ACCU-CHEK MULTICLIX LANCETS MISC: Status: DC

## 2014-08-30 LAB — HM MAMMOGRAPHY

## 2014-12-09 ENCOUNTER — Other Ambulatory Visit: Payer: Self-pay | Admitting: *Deleted

## 2014-12-09 DIAGNOSIS — E039 Hypothyroidism, unspecified: Secondary | ICD-10-CM

## 2014-12-09 MED ORDER — METFORMIN HCL 500 MG PO TABS: 500.0000 mg | ORAL_TABLET | Freq: Two times a day (BID) | ORAL | Status: DC

## 2014-12-09 MED ORDER — LEVOTHYROXINE SODIUM 50 MCG PO TABS: 50.0000 ug | ORAL_TABLET | Freq: Every day | ORAL | Status: DC

## 2014-12-09 NOTE — Telephone Encounter (Signed)
Refills sent for Metformin and levothyroxine to [facility] as requested

## 2015-01-12 ENCOUNTER — Telehealth: Payer: Self-pay | Admitting: Internal Medicine

## 2015-01-12 DIAGNOSIS — Z0000 Encounter for general adult medical examination without abnormal findings: Secondary | ICD-10-CM

## 2015-01-12 DIAGNOSIS — I10 Essential (primary) hypertension: Secondary | ICD-10-CM

## 2015-01-12 DIAGNOSIS — E119 Type 2 diabetes mellitus without complications: Secondary | ICD-10-CM

## 2015-01-12 MED ORDER — BLOOD PRESSURE MONITOR AUTOMAT DEVI: Status: DC

## 2015-01-12 MED ORDER — ACCU-CHEK AVIVA DEVI: Status: DC

## 2015-01-12 NOTE — Telephone Encounter (Signed)
Patient wants a Rx for a home BP machine and she has left her blood glucose monitor somewhere as well.  She needs a new one too.   She needs Rx sent to her pharmacy for both please.    Blood Pressure Machine Aqua Check - Needs Machine (she already has the test strips and lancets for THIS machine and that's why she is request this particular machine)  Pharmacy:  [place]

## 2015-01-12 NOTE — Telephone Encounter (Signed)
Please handle these requests

## 2015-01-15 ENCOUNTER — Encounter: Payer: Self-pay | Admitting: Internal Medicine

## 2015-03-10 ENCOUNTER — Other Ambulatory Visit: Payer: Self-pay | Admitting: Internal Medicine

## 2015-03-19 ENCOUNTER — Encounter: Payer: Self-pay | Admitting: Internal Medicine

## 2015-03-25 ENCOUNTER — Other Ambulatory Visit: Payer: Medicare HMO | Admitting: Internal Medicine

## 2015-03-30 ENCOUNTER — Encounter: Payer: Medicare HMO | Admitting: Internal Medicine

## 2015-03-30 ENCOUNTER — Other Ambulatory Visit: Payer: Self-pay | Admitting: Internal Medicine

## 2015-04-05 ENCOUNTER — Other Ambulatory Visit: Payer: Commercial Managed Care - HMO | Admitting: Internal Medicine

## 2015-04-05 DIAGNOSIS — E785 Hyperlipidemia, unspecified: Secondary | ICD-10-CM

## 2015-04-05 DIAGNOSIS — R5383 Other fatigue: Secondary | ICD-10-CM

## 2015-04-05 DIAGNOSIS — E039 Hypothyroidism, unspecified: Secondary | ICD-10-CM | POA: Diagnosis not present

## 2015-04-05 DIAGNOSIS — Z79899 Other long term (current) drug therapy: Secondary | ICD-10-CM | POA: Diagnosis not present

## 2015-04-05 DIAGNOSIS — E119 Type 2 diabetes mellitus without complications: Secondary | ICD-10-CM

## 2015-04-05 DIAGNOSIS — E559 Vitamin D deficiency, unspecified: Secondary | ICD-10-CM | POA: Diagnosis not present

## 2015-04-05 LAB — COMPLETE METABOLIC PANEL WITH GFR
ALBUMIN: 4.3 g/dL (ref 3.5–5.2)
ALT: <8 U/L (ref 0–35)
AST: 19 U/L (ref 0–37)
Alkaline Phosphatase: 42 U/L (ref 39–117)
BILIRUBIN TOTAL: 1 mg/dL (ref 0.2–1.2)
BUN: 22 mg/dL (ref 6–23)
CHLORIDE: 100 meq/L (ref 96–112)
CO2: 24 mEq/L (ref 19–32)
CREATININE: 0.73 mg/dL (ref 0.50–1.10)
Calcium: 9.2 mg/dL (ref 8.4–10.5)
GFR, EST NON AFRICAN AMERICAN: 85 mL/min
Glucose, Bld: 106 mg/dL — ABNORMAL HIGH (ref 70–99)
POTASSIUM: 4 meq/L (ref 3.5–5.3)
SODIUM: 133 meq/L — AB (ref 135–145)
TOTAL PROTEIN: 6.9 g/dL (ref 6.0–8.3)

## 2015-04-05 LAB — CBC WITH DIFFERENTIAL/PLATELET
Basophils Absolute: 0.1 10*3/uL (ref 0.0–0.1)
Basophils Relative: 1 % (ref 0–1)
Eosinophils Absolute: 0.6 10*3/uL (ref 0.0–0.7)
Eosinophils Relative: 9 % — ABNORMAL HIGH (ref 0–5)
HCT: 36.8 % (ref 36.0–46.0)
Hemoglobin: 12.1 g/dL (ref 12.0–15.0)
Lymphocytes Relative: 38 % (ref 12–46)
Lymphs Abs: 2.5 10*3/uL (ref 0.7–4.0)
MCH: 30.5 pg (ref 26.0–34.0)
MCHC: 32.9 g/dL (ref 30.0–36.0)
MCV: 92.7 fL (ref 78.0–100.0)
MPV: 9.4 fL (ref 8.6–12.4)
Monocytes Absolute: 0.5 10*3/uL (ref 0.1–1.0)
Monocytes Relative: 7 % (ref 3–12)
Neutro Abs: 3 10*3/uL (ref 1.7–7.7)
Neutrophils Relative %: 45 % (ref 43–77)
Platelets: 266 10*3/uL (ref 150–400)
RBC: 3.97 MIL/uL (ref 3.87–5.11)
RDW: 13.5 % (ref 11.5–15.5)
WBC: 6.6 10*3/uL (ref 4.0–10.5)

## 2015-04-05 LAB — TSH: TSH: 3.121 u[IU]/mL (ref 0.350–4.500)

## 2015-04-05 LAB — HEMOGLOBIN A1C
Hgb A1c MFr Bld: 6.8 % — ABNORMAL HIGH (ref <5.7)
Mean Plasma Glucose: 148 mg/dL — ABNORMAL HIGH (ref <117)

## 2015-04-05 LAB — LIPID PANEL
CHOLESTEROL: 166 mg/dL (ref 0–200)
HDL: 48 mg/dL (ref >=46)
LDL Cholesterol: 87 mg/dL (ref 0–99)
TRIGLYCERIDES: 157 mg/dL — AB (ref <150)
Total CHOL/HDL Ratio: 3.5 Ratio
VLDL: 31 mg/dL (ref 0–40)

## 2015-04-06 LAB — VITAMIN D 25 HYDROXY (VIT D DEFICIENCY, FRACTURES): Vit D, 25-Hydroxy: 38 ng/mL (ref 30–100)

## 2015-04-08 ENCOUNTER — Ambulatory Visit (INDEPENDENT_AMBULATORY_CARE_PROVIDER_SITE_OTHER): Payer: Commercial Managed Care - HMO | Admitting: Internal Medicine

## 2015-04-08 ENCOUNTER — Encounter: Payer: Self-pay | Admitting: Internal Medicine

## 2015-04-08 VITALS — BP 140/80 | HR 89 | Temp 98.8°F | Ht 62.0 in | Wt 151.5 lb

## 2015-04-08 DIAGNOSIS — E785 Hyperlipidemia, unspecified: Secondary | ICD-10-CM | POA: Diagnosis not present

## 2015-04-08 DIAGNOSIS — E119 Type 2 diabetes mellitus without complications: Secondary | ICD-10-CM | POA: Diagnosis not present

## 2015-04-08 DIAGNOSIS — Z23 Encounter for immunization: Secondary | ICD-10-CM

## 2015-04-08 DIAGNOSIS — Z0000 Encounter for general adult medical examination without abnormal findings: Secondary | ICD-10-CM | POA: Diagnosis not present

## 2015-04-08 DIAGNOSIS — E039 Hypothyroidism, unspecified: Secondary | ICD-10-CM

## 2015-04-09 LAB — MICROALBUMIN / CREATININE URINE RATIO
CREATININE, URINE: 105.7 mg/dL
Microalb Creat Ratio: 5.7 mg/g (ref 0.0–30.0)
Microalb, Ur: 0.6 mg/dL (ref <2.0)

## 2015-04-20 ENCOUNTER — Encounter: Payer: Self-pay | Admitting: *Deleted

## 2015-04-24 DIAGNOSIS — Z1231 Encounter for screening mammogram for malignant neoplasm of breast: Secondary | ICD-10-CM | POA: Diagnosis not present

## 2015-04-25 NOTE — Patient Instructions (Signed)
Return in one year or as needed. Consider colonoscopy. Have annual mammogram. Prevnar 13 given.

## 2015-04-25 NOTE — Progress Notes (Signed)
Subjective:    Patient ID: Paige Ali, female    DOB: 1945/11/18, 69 y.o.   MRN: [id number]  HPI  Very pleasant 69 year old Female in today for health maintenance exam and evaluation of medical issues. She had welcome to Medicare exam in 2015. History of adult-onset diabetes for which she takes metformin. Takes Lipitor for hyperlipidemia. She became diabetic approximate 2008 but old records indicate diabetes began in 2006. Diagnosed with hypothyroidism in 2012.  Social history: She has a daughter that lives in [place]. Son lives here. She operates a coffee shop downtown. She does not smoke. Social alcohol consumption. She exercises regularly at the [facility]. She swims and rides her bicycle. She is a native of [place]. She is a widow. She has a college degree. Has been then this country for about 14 years having emigrated from [place].  Family history: Father died 2014/05/06 at nearly 64 years of age from a leg infection. Mother in her late 65s with history of diabetes living. 2 brothers and 2 sisters healthy.    Review of Systems  Constitutional: Negative.   All other systems reviewed and are negative.      Objective:   Physical Exam  Constitutional: She is oriented to person, place, and time. She appears well-nourished. No distress.  HENT:  Head: Normocephalic and atraumatic.  Right Ear: External ear normal.  Left Ear: External ear normal.  Mouth/Throat: Oropharynx is clear and moist. No oropharyngeal exudate.  Eyes: Conjunctivae are normal. Pupils are equal, round, and reactive to light. Right eye exhibits no discharge. Left eye exhibits no discharge. No scleral icterus.  Neck: Neck supple. No JVD present. No thyromegaly present.  Cardiovascular: Normal rate, regular rhythm and normal heart sounds.   No murmur heard. Pulmonary/Chest: Effort normal and breath sounds normal. No respiratory distress. She has no wheezes. She has no rales.  Breasts normal female  Abdominal: Soft.  Bowel sounds are normal. She exhibits no distension and no mass. There is no tenderness. There is no rebound and no guarding.  Genitourinary:  Bimanual normal. Pap taken 2015  Musculoskeletal: She exhibits no edema.  Lymphadenopathy:    She has no cervical adenopathy.  Neurological: She is alert and oriented to person, place, and time. She has normal reflexes.  Skin: Skin is warm and dry. No rash noted. She is not diaphoretic.  Psychiatric: She has a normal mood and affect. Her behavior is normal. Judgment and thought content normal.  Vitals reviewed.         Assessment & Plan: Hyperlipidemia  Control tight 2 diabetes  Hypothyroidism  Plan: Continue same medications and return in 6 months:    Subjective:   Patient presents for Medicare Annual/Subsequent preventive examination.  Review Past Medical/Family/Social:See above   Risk Factors  Current exercise habits:Exercises at [facility], swims, rides bicyclery issues discussed:   Cardiac risk factors:Hyperlipidemia, diabetes   Depression Screen  (Note: if answer to either of the following is "Yes", a more complete depression screening is indicated)   Over the past two weeks, have you felt down, depressed or hopeless? No  Over the past two weeks, have you felt little interest or pleasure in doing things? No Have you lost interest or pleasure in daily life? No Do you often feel hopeless? No Do you cry easily over simple problems? No   Activities of Daily Living  In your present state of health, do you have any difficulty performing the following activities?:   Driving? No  Managing money?  No  Feeding yourself? No  Getting from bed to chair? No  Climbing a flight of stairs? No  Preparing food and eating?: No  Bathing or showering? No  Getting dressed: No  Getting to the toilet? No  Using the toilet:No  Moving around from place to place: No  In the past year have you fallen or had a near fall?:No  Are you sexually  active? No  Do you have more than one partner? No   Hearing Difficulties: No  Do you often ask people to speak up or repeat themselves? No  Do you experience ringing or noises in your ears? No  Do you have difficulty understanding soft or whispered voices? No  Do you feel that you have a problem with memory? No Do you often misplace items? No    Home Safety:  Do you have a smoke alarm at your residence? Yes Do you have grab bars in the bathroom? yes Do you have throw rugs in your house? yes   Cognitive Testing  Alert? Yes Normal Appearance?Yes  Oriented to person? Yes Place? Yes  Time? Yes  Recall of three objects? Yes  Can perform simple calculations? Yes  Displays appropriate judgment?Yes  Can read the correct time from a watch face?Yes   List the Names of Other Physician/Practitioners you currently use:  See referral list for the physicians patient is currently seeing.  Eye physician   Review of Systems:See above   Objective:     General appearance: Appears stated age  Head: Normocephalic, without obvious abnormality, atraumatic  Eyes: conj clear, EOMi PEERLA  Ears: normal TM's and external ear canals both ears  Nose: Nares normal. Septum midline. Mucosa normal. No drainage or sinus tenderness.  Throat: lips, mucosa, and tongue normal; teeth and gums normal  Neck: no adenopathy, no carotid bruit, no JVD, supple, symmetrical, trachea midline and thyroid not enlarged, symmetric, no tenderness/mass/nodules  No CVA tenderness.  Lungs: clear to auscultation bilaterally  Breasts: normal appearance, no masses or tenderness Heart: regular rate and rhythm, S1, S2 normal, no murmur, click, rub or gallop  Abdomen: soft, non-tender; bowel sounds normal; no masses, no organomegaly  Musculoskeletal: ROM normal in all joints, no crepitus, no deformity, Normal muscle strengthen. Back  is symmetric, no curvature. Skin: Skin color, texture, turgor normal. No rashes or lesions    Lymph nodes: Cervical, supraclavicular, and axillary nodes normal.  Neurologic: CN 2 -12 Normal, Normal symmetric reflexes. Normal coordination and gait  Psych: Alert & Oriented x 3, Mood appear stable.    Assessment:    Annual wellness medicare exam   Plan:    During the course of the visit the patient was educated and counseled about appropriate screening and preventive services including:   Annual mammogram Discussed colonoscopy    Patient Instructions (the written plan) was given to the patient.  Medicare Attestation  I have personally reviewed:  The patient's medical and social history  Their use of alcohol, tobacco or illicit drugs  Their current medications and supplements  The patient's functional ability including ADLs,fall risks, home safety risks, cognitive, and hearing and visual impairment  Diet and physical activities  Evidence for depression or mood disorders  The patient's weight, height, BMI, and visual acuity have been recorded in the chart. I have made referrals, counseling, and provided education to the patient based on review of the above and I have provided the patient with a written personalized care plan for preventive services.

## 2015-05-26 DIAGNOSIS — E119 Type 2 diabetes mellitus without complications: Secondary | ICD-10-CM | POA: Diagnosis not present

## 2015-05-26 DIAGNOSIS — H04123 Dry eye syndrome of bilateral lacrimal glands: Secondary | ICD-10-CM | POA: Diagnosis not present

## 2015-05-26 DIAGNOSIS — H40013 Open angle with borderline findings, low risk, bilateral: Secondary | ICD-10-CM | POA: Diagnosis not present

## 2015-05-26 DIAGNOSIS — H2513 Age-related nuclear cataract, bilateral: Secondary | ICD-10-CM | POA: Diagnosis not present

## 2015-05-26 DIAGNOSIS — H10413 Chronic giant papillary conjunctivitis, bilateral: Secondary | ICD-10-CM | POA: Diagnosis not present

## 2015-06-03 ENCOUNTER — Other Ambulatory Visit: Payer: Self-pay | Admitting: Internal Medicine

## 2015-06-29 ENCOUNTER — Other Ambulatory Visit: Payer: Self-pay | Admitting: Internal Medicine

## 2015-07-21 ENCOUNTER — Other Ambulatory Visit: Payer: Self-pay | Admitting: Internal Medicine

## 2015-09-30 ENCOUNTER — Encounter: Payer: Self-pay | Admitting: Internal Medicine

## 2015-09-30 ENCOUNTER — Ambulatory Visit (INDEPENDENT_AMBULATORY_CARE_PROVIDER_SITE_OTHER): Payer: Commercial Managed Care - HMO | Admitting: Internal Medicine

## 2015-09-30 VITALS — BP 140/88 | HR 108 | Temp 98.6°F | Resp 22 | Ht 62.0 in | Wt 150.0 lb

## 2015-09-30 DIAGNOSIS — J040 Acute laryngitis: Secondary | ICD-10-CM | POA: Diagnosis not present

## 2015-09-30 MED ORDER — BENZONATATE 200 MG PO CAPS: 200.0000 mg | ORAL_CAPSULE | Freq: Three times a day (TID) | ORAL | Status: DC | PRN

## 2015-09-30 MED ORDER — AZITHROMYCIN 250 MG PO TABS: ORAL_TABLET | ORAL | Status: DC

## 2015-09-30 MED ORDER — METHYLPREDNISOLONE ACETATE 80 MG/ML IJ SUSP
80.0000 mg | Freq: Once | INTRAMUSCULAR | Status: AC
  Administered 2015-09-30: 80 mg via INTRAMUSCULAR

## 2015-09-30 NOTE — Progress Notes (Signed)
   Subjective:    Patient ID: Paige Ali, female    DOB: 1945/12/24, 69 y.o.   MRN: [id number]  HPI She went to [place] to see family and to the [place]. Since returning home she's come down with an acute respiratory infection, coughing, laryngitis.    Review of Systems     Objective:   Physical Exam  Skin warm and dry. Nodes none. Pharynx is clear. TMs are full but not red. She is forcing can barely speak audibly. Neck is supple. Chest clear to auscultation.      Assessment & Plan:  Acute laryngitis  Plan: Depo-Medrol 80 mg IM. Tessalon Perles 200 mg 3 times a day when necessary cough #60 no refill. Zithromax Z-PAK take 2 tablets day one followed by 1 tablet days 2 through 5.

## 2015-09-30 NOTE — Patient Instructions (Signed)
Depo-Medrol 80 mg IM. Zithromax Z-Pak take as directed. Tessalon Perles as needed for cough.

## 2015-10-05 ENCOUNTER — Other Ambulatory Visit: Payer: Commercial Managed Care - HMO | Admitting: Internal Medicine

## 2015-10-05 DIAGNOSIS — E119 Type 2 diabetes mellitus without complications: Secondary | ICD-10-CM | POA: Diagnosis not present

## 2015-10-05 LAB — HEMOGLOBIN A1C
Hgb A1c MFr Bld: 6.7 % — ABNORMAL HIGH (ref <5.7)
Mean Plasma Glucose: 146 mg/dL — ABNORMAL HIGH (ref <117)

## 2015-10-07 ENCOUNTER — Encounter: Payer: Self-pay | Admitting: Internal Medicine

## 2015-10-07 ENCOUNTER — Ambulatory Visit (INDEPENDENT_AMBULATORY_CARE_PROVIDER_SITE_OTHER): Payer: Commercial Managed Care - HMO | Admitting: Internal Medicine

## 2015-10-07 VITALS — BP 150/90 | HR 103 | Temp 97.6°F | Resp 20 | Ht 62.0 in | Wt 150.0 lb

## 2015-10-07 DIAGNOSIS — E119 Type 2 diabetes mellitus without complications: Secondary | ICD-10-CM

## 2015-10-07 DIAGNOSIS — E785 Hyperlipidemia, unspecified: Secondary | ICD-10-CM

## 2015-10-07 DIAGNOSIS — E039 Hypothyroidism, unspecified: Secondary | ICD-10-CM | POA: Diagnosis not present

## 2015-10-26 ENCOUNTER — Telehealth: Payer: Self-pay | Admitting: Internal Medicine

## 2015-10-26 NOTE — Telephone Encounter (Signed)
She needs to be rechecked if still coughing

## 2015-10-26 NOTE — Telephone Encounter (Signed)
Says she's still having a terrible cough.  The Tessalon Perles doesn't seem to be helping so much.  She wants to know if there's a cough syrup that she can be prescribed instead that would provide some relief.  She feels she's worn out from coughing so much.  Otherwise she feels much better, just still coughing a lot.    Phone #:  [phone]  Pharmacy:  [facility] [place]

## 2015-10-26 NOTE — Telephone Encounter (Signed)
Appointment given for 12/28 @ 10:45.  Patient confirmed.

## 2015-10-26 NOTE — Telephone Encounter (Signed)
Patient notified and will be seen tomorrow morning

## 2015-10-27 ENCOUNTER — Encounter: Payer: Self-pay | Admitting: Internal Medicine

## 2015-10-27 ENCOUNTER — Ambulatory Visit (INDEPENDENT_AMBULATORY_CARE_PROVIDER_SITE_OTHER): Payer: Commercial Managed Care - HMO | Admitting: Internal Medicine

## 2015-10-27 VITALS — BP 128/88 | HR 88 | Temp 97.8°F | Resp 20 | Ht 62.0 in | Wt 151.0 lb

## 2015-10-27 DIAGNOSIS — M25512 Pain in left shoulder: Secondary | ICD-10-CM | POA: Diagnosis not present

## 2015-10-27 DIAGNOSIS — J209 Acute bronchitis, unspecified: Secondary | ICD-10-CM

## 2015-10-27 MED ORDER — METHYLPREDNISOLONE ACETATE 80 MG/ML IJ SUSP
80.0000 mg | Freq: Once | INTRAMUSCULAR | Status: AC
  Administered 2015-10-27: 80 mg via INTRAMUSCULAR

## 2015-10-27 MED ORDER — AZITHROMYCIN 250 MG PO TABS: ORAL_TABLET | ORAL | Status: DC

## 2015-10-27 MED ORDER — HYDROCODONE-HOMATROPINE 5-1.5 MG/5ML PO SYRP: 5.0000 mL | ORAL_SOLUTION | Freq: Three times a day (TID) | ORAL | Status: DC | PRN

## 2015-10-27 MED ORDER — CEFTRIAXONE SODIUM 1 G IJ SOLR
1.0000 g | INTRAMUSCULAR | Status: AC
  Administered 2015-10-27: 1 g via INTRAMUSCULAR

## 2015-10-27 NOTE — Progress Notes (Signed)
   Subjective:    Patient ID: Paige Ali, female    DOB: 08/27/1946, 69 y.o.   MRN: [id number]  HPI Patient seen December 1 after trip to [place] for the Thanksgiving holiday. She had an acute respiratory infection with cough and congestion. Was given a Zithromax Z-Pak and Tessalon Perles. Has been slow to improve. Continues to cough. No fever or shaking chills. Frustrated with coughing that is persistent. Continues to work. Is going to [place] for [employer].  She's also has some left arm pain which I think is musculoskeletal in nature. Taking Advil at night.  Review of Systems     Objective:   Physical Exam  Skin warm and dry. Nodes none. Pharynx is clear. TMs are clear. Neck is supple. Chest clear to auscultation without rales or wheezing.      Assessment & Plan:  Persistent cough/bronchitis  Musculoskeletal pain left arm and shoulder  Plan: Continue Advil for musculoskeletal pain. Depo-Medrol 80 mg IM. Rocephin 1 g IM. Refill Zithromax Z-PAK. Prescribed Hycodan 1 teaspoon by mouth every 8 hours when necessary cough.

## 2015-10-27 NOTE — Patient Instructions (Addendum)
Depo-Medrol 80 mg IM given. Rocephin 1 g IM given. Refill Zithromax Z-PAK take as directed. Hycodan 1 teaspoon by mouth every 8 hours when necessary cough. Advil as needed for shoulder pain.

## 2015-10-30 NOTE — Progress Notes (Signed)
   Subjective:    Patient ID: Paige Ali, female    DOB: [birth year], 69 y.o.   MRN: [id number]  HPI She was here December 1 with laryngitis and acute URI after trip to [place] during Thanksgiving holidays. Says she is a bit better. Still coughing some. This appointment is her six-month recheck on hypothyroidism, hyperlipidemia and controlled type 2 diabetes mellitus. She exercises and takes good care of herself.    Review of Systems     Objective:   Physical Exam  Skin warm and dry. Nodes none. Chest clear. Cardiac exam regular rate and rhythm normal S1 and S2. Extremities without edema.      Assessment & Plan:  Controlled type 2 diabetes mellitus. Hemoglobin A1c 6.7%. Reminded about yearly eye exam  Hypothyroidism-continue same dose of thyroid replacement. TSH was checked in June  Hyperlipidemia-lipid panel essentially normal in June continue statin medication  Recent URI-improving  Plan: Return in 6 months for physical exam

## 2015-10-30 NOTE — Patient Instructions (Signed)
Continue diet and exercise efforts. Continue same medications. Return in 6 months for physical exam. It was a pleasure to see you today.

## 2015-12-28 ENCOUNTER — Other Ambulatory Visit: Payer: Self-pay | Admitting: Internal Medicine

## 2015-12-28 NOTE — Telephone Encounter (Signed)
Please refill x 6 months 

## 2016-04-10 ENCOUNTER — Other Ambulatory Visit: Payer: Commercial Managed Care - HMO | Admitting: Internal Medicine

## 2016-04-10 DIAGNOSIS — Z130 Encounter for screening for diseases of the blood and blood-forming organs and certain disorders involving the immune mechanism: Secondary | ICD-10-CM

## 2016-04-10 DIAGNOSIS — Z0000 Encounter for general adult medical examination without abnormal findings: Secondary | ICD-10-CM

## 2016-04-10 DIAGNOSIS — E039 Hypothyroidism, unspecified: Secondary | ICD-10-CM | POA: Diagnosis not present

## 2016-04-10 DIAGNOSIS — E785 Hyperlipidemia, unspecified: Secondary | ICD-10-CM | POA: Diagnosis not present

## 2016-04-10 DIAGNOSIS — Z79899 Other long term (current) drug therapy: Secondary | ICD-10-CM

## 2016-04-10 DIAGNOSIS — E119 Type 2 diabetes mellitus without complications: Secondary | ICD-10-CM | POA: Diagnosis not present

## 2016-04-10 DIAGNOSIS — Z1321 Encounter for screening for nutritional disorder: Secondary | ICD-10-CM

## 2016-04-10 LAB — CBC WITH DIFFERENTIAL/PLATELET
BASOS ABS: 0 {cells}/uL (ref 0–200)
BASOS PCT: 0 %
EOS ABS: 348 {cells}/uL (ref 15–500)
EOS PCT: 6 %
HCT: 38.4 % (ref 35.0–45.0)
HEMOGLOBIN: 12.6 g/dL (ref 11.7–15.5)
LYMPHS ABS: 2726 {cells}/uL (ref 850–3900)
Lymphocytes Relative: 47 %
MCH: 30.9 pg (ref 27.0–33.0)
MCHC: 32.8 g/dL (ref 32.0–36.0)
MCV: 94.1 fL (ref 80.0–100.0)
MONOS PCT: 7 %
MPV: 9.3 fL (ref 7.5–12.5)
Monocytes Absolute: 406 cells/uL (ref 200–950)
NEUTROS ABS: 2320 {cells}/uL (ref 1500–7800)
Neutrophils Relative %: 40 %
Platelets: 265 10*3/uL (ref 140–400)
RBC: 4.08 MIL/uL (ref 3.80–5.10)
RDW: 13.4 % (ref 11.0–15.0)
WBC: 5.8 10*3/uL (ref 3.8–10.8)

## 2016-04-10 LAB — TSH: TSH: 2.24 mIU/L

## 2016-04-10 LAB — HEMOGLOBIN A1C
HEMOGLOBIN A1C: 6.7 % — AB (ref <5.7)
Mean Plasma Glucose: 146 mg/dL

## 2016-04-11 LAB — COMPLETE METABOLIC PANEL WITH GFR
ALBUMIN: 4.3 g/dL (ref 3.6–5.1)
ALK PHOS: 53 U/L (ref 33–130)
ALT: 6 U/L (ref 6–29)
AST: 17 U/L (ref 10–35)
BILIRUBIN TOTAL: 0.8 mg/dL (ref 0.2–1.2)
BUN: 20 mg/dL (ref 7–25)
CALCIUM: 9.3 mg/dL (ref 8.6–10.4)
CO2: 24 mmol/L (ref 20–31)
Chloride: 103 mmol/L (ref 98–110)
Creat: 0.72 mg/dL (ref 0.50–0.99)
GFR, EST NON AFRICAN AMERICAN: 86 mL/min (ref >=60)
GLUCOSE: 106 mg/dL — AB (ref 65–99)
Potassium: 4.3 mmol/L (ref 3.5–5.3)
Sodium: 138 mmol/L (ref 135–146)
TOTAL PROTEIN: 6.9 g/dL (ref 6.1–8.1)

## 2016-04-11 LAB — LIPID PANEL
Cholesterol: 187 mg/dL (ref 125–200)
HDL: 58 mg/dL (ref >=46)
LDL Cholesterol: 99 mg/dL (ref <130)
TRIGLYCERIDES: 149 mg/dL (ref <150)
Total CHOL/HDL Ratio: 3.2 Ratio (ref <=5.0)
VLDL: 30 mg/dL (ref <30)

## 2016-04-11 LAB — MICROALBUMIN, URINE

## 2016-04-11 LAB — VITAMIN D 25 HYDROXY (VIT D DEFICIENCY, FRACTURES): VIT D 25 HYDROXY: 38 ng/mL (ref 30–100)

## 2016-04-13 ENCOUNTER — Encounter: Payer: Self-pay | Admitting: Internal Medicine

## 2016-04-13 ENCOUNTER — Ambulatory Visit (INDEPENDENT_AMBULATORY_CARE_PROVIDER_SITE_OTHER): Payer: Commercial Managed Care - HMO | Admitting: Internal Medicine

## 2016-04-13 VITALS — BP 130/82 | HR 68 | Temp 98.1°F | Resp 18 | Ht 62.0 in | Wt 153.0 lb

## 2016-04-13 DIAGNOSIS — E039 Hypothyroidism, unspecified: Secondary | ICD-10-CM

## 2016-04-13 DIAGNOSIS — E119 Type 2 diabetes mellitus without complications: Secondary | ICD-10-CM

## 2016-04-13 DIAGNOSIS — Z0000 Encounter for general adult medical examination without abnormal findings: Secondary | ICD-10-CM | POA: Diagnosis not present

## 2016-04-13 DIAGNOSIS — E785 Hyperlipidemia, unspecified: Secondary | ICD-10-CM

## 2016-04-13 DIAGNOSIS — M25511 Pain in right shoulder: Secondary | ICD-10-CM | POA: Diagnosis not present

## 2016-04-13 NOTE — Progress Notes (Addendum)
Subjective:    Patient ID: Paige Ali, female    DOB: [birth year], 70 y.o.   MRN: [id number]  HPI 70 year old Female in today for health maintenance exam and evaluation of medical issues. She just returned from trip overseas to her home country. She had a good time. She got some dental work done. She needs to have a mammogram in the near future.  Continues to exercise regularly. She has just started taking some chondroitin sulfate and glucosamine with MSM  for her right shoulder pain. When she was away from work or shoulder did not hurt. When she returned back to work, right shoulder began to bother her again.  She has a history of adult onset diabetes for which she takes metformin. She takes Lipitor for hyperlipidemia. Old records indicate she became diabetic around 2006.  Social history: She's been in this country now about 15 years. She has a college degree. She is a widow. Does not smoke. She operates a coffee shop downtown. Daughter lives in [place], [place]. She does not smoke. Social alcohol consumption. She exercises regularly at [facility] which includes swimming. She also rides her bicycle regularly.  Family history: Father passed away at nearly 15 years of age due to complications of a leg infection. He lived in [place]. Mother with history of diabetes still living. 2 brothers and 2 sisters that are healthy.    Review of Systems  Constitutional: Negative.   All other systems reviewed and are negative.      Objective:   Physical Exam  Constitutional: She is oriented to person, place, and time. She appears well-developed and well-nourished. No distress.  HENT:  Head: Normocephalic and atraumatic.  Right Ear: External ear normal.  Left Ear: External ear normal.  Mouth/Throat: Oropharynx is clear and moist.  Eyes: Conjunctivae and EOM are normal. Pupils are equal, round, and reactive to light. Right eye exhibits no discharge. Left eye exhibits no discharge. No scleral icterus.   Neck: Neck supple. No JVD present. No thyromegaly present.  Cardiovascular: Normal rate, regular rhythm, normal heart sounds and intact distal pulses.   No murmur heard. Pulmonary/Chest: Effort normal and breath sounds normal. No respiratory distress. She has no wheezes. She has no rales. She exhibits no tenderness.  Breasts normal female  Abdominal: Soft. Bowel sounds are normal. She exhibits no distension and no mass. There is no tenderness. There is no rebound and no guarding.  Genitourinary:  Deferred  Musculoskeletal: She exhibits no edema.  Lymphadenopathy:    She has no cervical adenopathy.  Neurological: She is alert and oriented to person, place, and time. She has normal reflexes. No cranial nerve deficit. Coordination normal.  Skin: Skin is warm and dry. No rash noted. She is not diaphoretic.  Psychiatric: She has a normal mood and affect. Her behavior is normal. Judgment and thought content normal.  Vitals reviewed.         Assessment & Plan:  Controlled type 2 diabetes mellitus-Hemoglobin A1c stable at 6.7%. Continue metformin.  Hyperlipidemia-Lipid panel and liver functions are entirely within normal limits  Hypothyroidism-TSH within normal limits on current dose of Synthroid  Right shoulder pain  Plan: Recommend annual diabetic eye exam. Reviewed lab work with her which is stable. Continue same medications. Recommend mammogram in the near future. Return in 6 months for office visit, TSH, lipid panel, liver functions and hemoglobin A1c.  Needs Pneumococcal 23 vaccine when she returns. She has had Prevnar 13.   Subjective:   Patient presents for  Medicare Annual/Subsequent preventive examination.  Review Past Medical/Family/Social:See above   Risk Factors  Current exercise habits: Exercises regularly with swimming and bicycle riding Dietary issues discussed: Low fat low carbohydrate  Cardiac risk factors:Diabetes and hyperlipidemia  Depression Screen    (Note: if answer to either of the following is "Yes", a more complete depression screening is indicated)   Over the past two weeks, have you felt down, depressed or hopeless? No  Over the past two weeks, have you felt little interest or pleasure in doing things? No Have you lost interest or pleasure in daily life? No Do you often feel hopeless? No Do you cry easily over simple problems? No   Activities of Daily Living  In your present state of health, do you have any difficulty performing the following activities?:   Driving? No  Managing money? No  Feeding yourself? No  Getting from bed to chair? No  Climbing a flight of stairs? No  Preparing food and eating?: No  Bathing or showering? No  Getting dressed: No  Getting to the toilet? No  Using the toilet:No  Moving around from place to place: No  In the past year have you fallen or had a near fall?:No  Are you sexually active? No  Do you have more than one partner? No   Hearing Difficulties: No  Do you often ask people to speak up or repeat themselves? No  Do you experience ringing or noises in your ears? No  Do you have difficulty understanding soft or whispered voices? No  Do you feel that you have a problem with memory? No Do you often misplace items? No    Home Safety:  Do you have a smoke alarm at your residence? Yes Do you have grab bars in the bathroom? No Do you have throw rugs in your house? Yes   Cognitive Testing  Alert? Yes Normal Appearance?Yes  Oriented to person? Yes Place? Yes  Time? Yes  Recall of three objects? Yes  Can perform simple calculations? Yes  Displays appropriate judgment?Yes  Can read the correct time from a watch face?Yes   List the Names of Other Physician/Practitioners you currently use:  See referral list for the physicians patient is currently seeing.     Review of Systems: See above   Objective:     General appearance: Appears stated age and mildly obese  Head:  Normocephalic, without obvious abnormality, atraumatic  Eyes: conj clear, EOMi PEERLA  Ears: normal TM's and external ear canals both ears  Nose: Nares normal. Septum midline. Mucosa normal. No drainage or sinus tenderness.  Throat: lips, mucosa, and tongue normal; teeth and gums normal  Neck: no adenopathy, no carotid bruit, no JVD, supple, symmetrical, trachea midline and thyroid not enlarged, symmetric, no tenderness/mass/nodules  No CVA tenderness.  Lungs: clear to auscultation bilaterally  Breasts: normal appearance, no masses or tenderness Heart: regular rate and rhythm, S1, S2 normal, no murmur, click, rub or gallop  Abdomen: soft, non-tender; bowel sounds normal; no masses, no organomegaly  Musculoskeletal: ROM normal in all joints, no crepitus, no deformity, Normal muscle strengthen. Back  is symmetric, no curvature. Skin: Skin color, texture, turgor normal. No rashes or lesions  Lymph nodes: Cervical, supraclavicular, and axillary nodes normal.  Neurologic: CN 2 -12 Normal, Normal symmetric reflexes. Normal coordination and gait  Psych: Alert & Oriented x 3, Mood appear stable.    Assessment:    Annual wellness medicare exam   Plan:    During the  course of the visit the patient was educated and counseled about appropriate screening and preventive services including:   Annual mammogram  Annual diabetic eye exam recommended     Patient Instructions (the written plan) was given to the patient.  Medicare Attestation  I have personally reviewed:  The patient's medical and social history  Their use of alcohol, tobacco or illicit drugs  Their current medications and supplements  The patient's functional ability including ADLs,fall risks, home safety risks, cognitive, and hearing and visual impairment  Diet and physical activities  Evidence for depression or mood disorders  The patient's weight, height, BMI, and visual acuity have been recorded in the chart. I have made  referrals, counseling, and provided education to the patient based on review of the above and I have provided the patient with a written personalized care plan for preventive services.

## 2016-04-13 NOTE — Patient Instructions (Signed)
Continue same medications and return in 6 months. Please have mammogram. It was a pleasure to see you today.

## 2016-04-24 ENCOUNTER — Encounter: Payer: Self-pay | Admitting: Internal Medicine

## 2016-04-24 DIAGNOSIS — Z1231 Encounter for screening mammogram for malignant neoplasm of breast: Secondary | ICD-10-CM | POA: Diagnosis not present

## 2016-05-27 ENCOUNTER — Other Ambulatory Visit: Payer: Self-pay | Admitting: Internal Medicine

## 2016-06-20 ENCOUNTER — Other Ambulatory Visit: Payer: Self-pay | Admitting: Internal Medicine

## 2016-08-11 ENCOUNTER — Other Ambulatory Visit: Payer: Self-pay | Admitting: Internal Medicine

## 2016-09-05 ENCOUNTER — Encounter: Payer: Self-pay | Admitting: Internal Medicine

## 2016-09-05 ENCOUNTER — Ambulatory Visit (INDEPENDENT_AMBULATORY_CARE_PROVIDER_SITE_OTHER): Payer: Commercial Managed Care - HMO | Admitting: Internal Medicine

## 2016-09-05 VITALS — BP 120/88 | HR 76 | Temp 96.4°F | Wt 151.0 lb

## 2016-09-05 DIAGNOSIS — E785 Hyperlipidemia, unspecified: Secondary | ICD-10-CM | POA: Diagnosis not present

## 2016-09-05 DIAGNOSIS — R05 Cough: Secondary | ICD-10-CM | POA: Diagnosis not present

## 2016-09-05 DIAGNOSIS — E559 Vitamin D deficiency, unspecified: Secondary | ICD-10-CM | POA: Diagnosis not present

## 2016-09-05 DIAGNOSIS — E118 Type 2 diabetes mellitus with unspecified complications: Secondary | ICD-10-CM | POA: Diagnosis not present

## 2016-09-05 DIAGNOSIS — E039 Hypothyroidism, unspecified: Secondary | ICD-10-CM | POA: Diagnosis not present

## 2016-09-05 DIAGNOSIS — R059 Cough, unspecified: Secondary | ICD-10-CM

## 2016-09-05 LAB — POCT URINALYSIS DIPSTICK
BILIRUBIN UA: NEGATIVE
Blood, UA: NEGATIVE
Glucose, UA: NEGATIVE
Ketones, UA: NEGATIVE
LEUKOCYTES UA: NEGATIVE
NITRITE UA: NEGATIVE
PH UA: 6.5
Protein, UA: NEGATIVE
Spec Grav, UA: <=1.005
Urobilinogen, UA: 0.2

## 2016-09-05 LAB — COMPLETE METABOLIC PANEL WITH GFR
ALT: 7 U/L (ref 6–29)
AST: 22 U/L (ref 10–35)
Albumin: 4.5 g/dL (ref 3.6–5.1)
Alkaline Phosphatase: 55 U/L (ref 33–130)
BUN: 14 mg/dL (ref 7–25)
CALCIUM: 9.7 mg/dL (ref 8.6–10.4)
CHLORIDE: 101 mmol/L (ref 98–110)
CO2: 23 mmol/L (ref 20–31)
CREATININE: 0.86 mg/dL (ref 0.50–0.99)
GFR, Est African American: 80 mL/min (ref >=60)
GFR, Est Non African American: 70 mL/min (ref >=60)
GLUCOSE: 103 mg/dL — AB (ref 65–99)
Potassium: 4.1 mmol/L (ref 3.5–5.3)
SODIUM: 136 mmol/L (ref 135–146)
Total Bilirubin: 1.1 mg/dL (ref 0.2–1.2)
Total Protein: 7.2 g/dL (ref 6.1–8.1)

## 2016-09-05 LAB — LIPID PANEL
Cholesterol: 174 mg/dL (ref <200)
HDL: 50 mg/dL — ABNORMAL LOW (ref >50)
LDL CALC: 89 mg/dL
TRIGLYCERIDES: 173 mg/dL — AB (ref <150)
Total CHOL/HDL Ratio: 3.5 Ratio (ref <5.0)
VLDL: 35 mg/dL — AB (ref <30)

## 2016-09-05 LAB — CBC WITH DIFFERENTIAL/PLATELET
BASOS ABS: 64 {cells}/uL (ref 0–200)
Basophils Relative: 1 %
EOS ABS: 448 {cells}/uL (ref 15–500)
Eosinophils Relative: 7 %
HEMATOCRIT: 38.5 % (ref 35.0–45.0)
HEMOGLOBIN: 12.8 g/dL (ref 11.7–15.5)
LYMPHS PCT: 48 %
Lymphs Abs: 3072 cells/uL (ref 850–3900)
MCH: 31.1 pg (ref 27.0–33.0)
MCHC: 33.2 g/dL (ref 32.0–36.0)
MCV: 93.4 fL (ref 80.0–100.0)
MONO ABS: 448 {cells}/uL (ref 200–950)
MPV: 9.3 fL (ref 7.5–12.5)
Monocytes Relative: 7 %
NEUTROS PCT: 37 %
Neutro Abs: 2368 cells/uL (ref 1500–7800)
Platelets: 271 10*3/uL (ref 140–400)
RBC: 4.12 MIL/uL (ref 3.80–5.10)
RDW: 13.5 % (ref 11.0–15.0)
WBC: 6.4 10*3/uL (ref 3.8–10.8)

## 2016-09-05 LAB — TSH: TSH: 3.68 m[IU]/L

## 2016-09-05 MED ORDER — BENZONATATE 100 MG PO CAPS: 100.0000 mg | ORAL_CAPSULE | Freq: Two times a day (BID) | ORAL | 0 refills | Status: DC | PRN

## 2016-09-05 MED ORDER — METHYLPREDNISOLONE ACETATE 80 MG/ML IJ SUSP
80.0000 mg | INTRAMUSCULAR | Status: AC
  Administered 2016-09-05: 80 mg via INTRAMUSCULAR

## 2016-09-05 MED ORDER — AZITHROMYCIN 250 MG PO TABS: ORAL_TABLET | ORAL | 0 refills | Status: DC

## 2016-09-05 NOTE — Progress Notes (Signed)
   Subjective:    Patient ID: Paige Ali, female    DOB: 12-21-1945, 70 y.o.   MRN: [id number]  HPI  Leaving next week to go on trip to [place], [place], and [place]. She's been having a cough recently. She is concerned about it. Takes it may be allergy. Her right eye waters sometimes when outdoors air comes in contact with it. Says it started after she had a stye sometime ago She needs to see eye physician  about that.  Fasting labs drawn for upcoming physical exam    Review of Systems no new complaints. Cough is not productive. No fever or chills.     Objective:   Physical Exam Skin warm and dry. Nodes none. TMs and pharynx are clear. Neck is supple. Chest clear to auscultation.       Assessment & Plan:  Cough  Plan: Depo-Medrol 80 mg IM. Prescription for Zithromax Z-Pak to take on trip. Tessalon Perles 100 mg 3 times daily as needed for cough.

## 2016-09-05 NOTE — Patient Instructions (Signed)
Tessalon Perles 100 mg 3 times daily as needed for cough. Zithromax Z-PAK to take on trip. Depo-Medrol 80 mg IM.

## 2016-09-06 LAB — HEMOGLOBIN A1C
HEMOGLOBIN A1C: 6.5 % — AB (ref <5.7)
MEAN PLASMA GLUCOSE: 140 mg/dL

## 2016-09-06 LAB — MICROALBUMIN / CREATININE URINE RATIO
CREATININE, URINE: 16 mg/dL — AB (ref 20–320)
Microalb, Ur: <0.2 mg/dL

## 2016-09-06 LAB — VITAMIN D 25 HYDROXY (VIT D DEFICIENCY, FRACTURES): Vit D, 25-Hydroxy: 37 ng/mL (ref 30–100)

## 2016-10-05 ENCOUNTER — Other Ambulatory Visit: Payer: Commercial Managed Care - HMO | Admitting: Internal Medicine

## 2016-10-09 ENCOUNTER — Ambulatory Visit: Payer: Commercial Managed Care - HMO | Admitting: Internal Medicine

## 2016-12-21 ENCOUNTER — Other Ambulatory Visit: Payer: Self-pay | Admitting: Internal Medicine

## 2017-01-03 ENCOUNTER — Telehealth: Payer: Self-pay | Admitting: Internal Medicine

## 2017-01-03 ENCOUNTER — Encounter: Payer: Self-pay | Admitting: Internal Medicine

## 2017-01-03 MED ORDER — METFORMIN HCL 500 MG PO TABS: 500.0000 mg | ORAL_TABLET | Freq: Two times a day (BID) | ORAL | 1 refills | Status: DC

## 2017-01-03 NOTE — Telephone Encounter (Signed)
Needs refill on metformin to local pharmacy. Requested prescription be called into [facility]. [facility] is lagging behind in her mail order delivery.

## 2017-02-26 ENCOUNTER — Telehealth: Payer: Self-pay | Admitting: Internal Medicine

## 2017-02-26 NOTE — Telephone Encounter (Signed)
Pt is having acute possible tendonitis in her right hand.  Please advise

## 2017-02-26 NOTE — Telephone Encounter (Signed)
Needs to see orthopedist 

## 2017-02-26 NOTE — Telephone Encounter (Signed)
Called and spoke with pt, got her an appointment with Dr. Alfonso Ramus at [facility] where she is a previous patient 02/27/2017 10:00 arrival.  Pt call if she cannot get a ride

## 2017-02-27 DIAGNOSIS — S46011A Strain of muscle(s) and tendon(s) of the rotator cuff of right shoulder, initial encounter: Secondary | ICD-10-CM | POA: Diagnosis not present

## 2017-02-27 DIAGNOSIS — M25511 Pain in right shoulder: Secondary | ICD-10-CM | POA: Diagnosis not present

## 2017-03-27 DIAGNOSIS — M25511 Pain in right shoulder: Secondary | ICD-10-CM | POA: Diagnosis not present

## 2017-03-29 DIAGNOSIS — S46011D Strain of muscle(s) and tendon(s) of the rotator cuff of right shoulder, subsequent encounter: Secondary | ICD-10-CM | POA: Diagnosis not present

## 2017-04-10 ENCOUNTER — Other Ambulatory Visit: Payer: Commercial Managed Care - HMO | Admitting: Internal Medicine

## 2017-04-10 DIAGNOSIS — Z0000 Encounter for general adult medical examination without abnormal findings: Secondary | ICD-10-CM

## 2017-04-10 DIAGNOSIS — E118 Type 2 diabetes mellitus with unspecified complications: Secondary | ICD-10-CM | POA: Diagnosis not present

## 2017-04-10 DIAGNOSIS — E785 Hyperlipidemia, unspecified: Secondary | ICD-10-CM

## 2017-04-10 DIAGNOSIS — E039 Hypothyroidism, unspecified: Secondary | ICD-10-CM | POA: Diagnosis not present

## 2017-04-10 LAB — CBC WITH DIFFERENTIAL/PLATELET
Basophils Absolute: 64 cells/uL (ref 0–200)
Basophils Relative: 1 %
EOS ABS: 384 {cells}/uL (ref 15–500)
Eosinophils Relative: 6 %
HCT: 39.6 % (ref 35.0–45.0)
HEMOGLOBIN: 12.7 g/dL (ref 11.7–15.5)
LYMPHS ABS: 2816 {cells}/uL (ref 850–3900)
Lymphocytes Relative: 44 %
MCH: 30.3 pg (ref 27.0–33.0)
MCHC: 32.1 g/dL (ref 32.0–36.0)
MCV: 94.5 fL (ref 80.0–100.0)
MPV: 9.3 fL (ref 7.5–12.5)
Monocytes Absolute: 512 cells/uL (ref 200–950)
Monocytes Relative: 8 %
NEUTROS ABS: 2624 {cells}/uL (ref 1500–7800)
NEUTROS PCT: 41 %
Platelets: 281 10*3/uL (ref 140–400)
RBC: 4.19 MIL/uL (ref 3.80–5.10)
RDW: 13.8 % (ref 11.0–15.0)
WBC: 6.4 10*3/uL (ref 3.8–10.8)

## 2017-04-10 LAB — TSH: TSH: 3.49 m[IU]/L

## 2017-04-10 LAB — COMPLETE METABOLIC PANEL WITH GFR
ALBUMIN: 4.3 g/dL (ref 3.6–5.1)
ALK PHOS: 55 U/L (ref 33–130)
ALT: 6 U/L (ref 6–29)
AST: 16 U/L (ref 10–35)
BILIRUBIN TOTAL: 1.1 mg/dL (ref 0.2–1.2)
BUN: 17 mg/dL (ref 7–25)
CALCIUM: 9.3 mg/dL (ref 8.6–10.4)
CO2: 24 mmol/L (ref 20–31)
Chloride: 101 mmol/L (ref 98–110)
Creat: 0.78 mg/dL (ref 0.50–0.99)
GFR, EST NON AFRICAN AMERICAN: 78 mL/min (ref >=60)
Glucose, Bld: 124 mg/dL — ABNORMAL HIGH (ref 65–99)
POTASSIUM: 4.2 mmol/L (ref 3.5–5.3)
Sodium: 137 mmol/L (ref 135–146)
TOTAL PROTEIN: 7 g/dL (ref 6.1–8.1)

## 2017-04-10 LAB — LIPID PANEL
CHOLESTEROL: 171 mg/dL (ref <200)
HDL: 48 mg/dL — ABNORMAL LOW (ref >50)
LDL Cholesterol: 85 mg/dL (ref <100)
TRIGLYCERIDES: 190 mg/dL — AB (ref <150)
Total CHOL/HDL Ratio: 3.6 Ratio (ref <5.0)
VLDL: 38 mg/dL — ABNORMAL HIGH (ref <30)

## 2017-04-11 LAB — HEMOGLOBIN A1C
HEMOGLOBIN A1C: 6.9 % — AB (ref <5.7)
MEAN PLASMA GLUCOSE: 151 mg/dL

## 2017-04-11 LAB — MICROALBUMIN / CREATININE URINE RATIO: Creatinine, Urine: 25 mg/dL (ref 20–320)

## 2017-04-12 ENCOUNTER — Other Ambulatory Visit: Payer: Commercial Managed Care - HMO | Admitting: Internal Medicine

## 2017-04-16 ENCOUNTER — Ambulatory Visit (INDEPENDENT_AMBULATORY_CARE_PROVIDER_SITE_OTHER): Payer: Commercial Managed Care - HMO | Admitting: Internal Medicine

## 2017-04-16 VITALS — BP 130/88 | HR 87 | Temp 99.0°F | Ht 62.0 in | Wt 152.0 lb

## 2017-04-16 DIAGNOSIS — E118 Type 2 diabetes mellitus with unspecified complications: Secondary | ICD-10-CM | POA: Diagnosis not present

## 2017-04-16 DIAGNOSIS — E039 Hypothyroidism, unspecified: Secondary | ICD-10-CM

## 2017-04-16 DIAGNOSIS — Z6827 Body mass index (BMI) 27.0-27.9, adult: Secondary | ICD-10-CM

## 2017-04-16 DIAGNOSIS — M75101 Unspecified rotator cuff tear or rupture of right shoulder, not specified as traumatic: Secondary | ICD-10-CM

## 2017-04-16 DIAGNOSIS — Z0000 Encounter for general adult medical examination without abnormal findings: Secondary | ICD-10-CM | POA: Diagnosis not present

## 2017-04-16 DIAGNOSIS — E785 Hyperlipidemia, unspecified: Secondary | ICD-10-CM | POA: Diagnosis not present

## 2017-04-16 LAB — POCT URINALYSIS DIPSTICK
Bilirubin, UA: NEGATIVE
GLUCOSE UA: NEGATIVE
Ketones, UA: NEGATIVE
Leukocytes, UA: NEGATIVE
NITRITE UA: NEGATIVE
PROTEIN UA: NEGATIVE
Spec Grav, UA: 1.01 (ref 1.010–1.025)
UROBILINOGEN UA: 0.2 U/dL
pH, UA: 6 (ref 5.0–8.0)

## 2017-04-16 NOTE — Progress Notes (Signed)
Subjective:    Patient ID: Paige Ali, female    DOB: 09-04-1946, 71 y.o.   MRN: [id number]  HPI 71 year old Female for health maintenance exam and evaluation of medical issues.She has history of right shoulder arthropathy and has seen orthopedist. They were considering surgery but she has decided not to go through with the surgery at the present time. She feels that the shoulder has gotten a bit better.Orthopedic office notified of her decision today.  She has a history of hyperlipidemia and impaired glucose tolerance.  Continues to exercise regularly. Continues to work in coffee shop.  History of adult-onset diabetes mellitus for which she takes metformin. She takes Lipitor for hyperlipidemia. Old records indicate she became diabetic around 2006.  Social history: She is been in the [place] now for about 16 years, moved from [place]. She has a college degree. She is a widow. Does not smoke. Daughter lives in [place]. Patient operates a coffee shop downtown. She does not smoke. Social alcohol consumption. She swims at the [facility] and rides her bicycle.  Family history: Father passed away at nearly 39 years of age due to complications of a leg infection. He lived in [place]. Mother with history of diabetes still living. 2 brothers and 2 sisters that are healthy.    Review of Systems no new complaints     Objective:   Physical Exam  Constitutional: She is oriented to person, place, and time. She appears well-developed and well-nourished. No distress.  HENT:  Head: Normocephalic and atraumatic.  Right Ear: External ear normal.  Left Ear: External ear normal.  Mouth/Throat: Oropharynx is clear and moist. No oropharyngeal exudate.  Eyes: Conjunctivae and EOM are normal. Pupils are equal, round, and reactive to light. Right eye exhibits no discharge. Left eye exhibits no discharge. No scleral icterus.  Neck: Neck supple. No JVD present. No thyromegaly present.  Cardiovascular:  Normal rate, regular rhythm and normal heart sounds.   No murmur heard. Pulmonary/Chest: Effort normal and breath sounds normal. She has no wheezes. She has no rales.  Breasts normal female without masses  Abdominal: Soft. Bowel sounds are normal. She exhibits no distension and no mass. There is no tenderness. There is no rebound and no guarding.  Genitourinary:  Genitourinary Comments: Pap deferred due to age. Bimanual normal.  Musculoskeletal: She exhibits no edema.  Lymphadenopathy:    She has no cervical adenopathy.  Neurological: She is alert and oriented to person, place, and time. She has normal reflexes. No cranial nerve deficit.  Skin: Skin is warm and dry. No rash noted. She is not diaphoretic.  Psychiatric: She has a normal mood and affect. Her behavior is normal. Judgment and thought content normal.  Vitals reviewed.         Assessment & Plan:  Normal health maintenance exam  Right shoulder arthropathy-patient was postponed surgery. Note from Dr. Noemi Chapel says she has right shoulder acute traumatic rotator cuff tear with biceps tendon subluxation with impingement. She has had a recent MRI. 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   Type 2 diabetes mellitus-stable and under good control. Takes metformin. Hemoglobin A1c 6.9% and was 6.5% 7 months ago. Fasting glucose 124. Consider adding another medication in 6 months if A1c is still elevated. I don't think she's been exercising quite as much due to her shoulder pain.  Hypothyroidism-stable on low-dose Synthroid with normal TSH.  Hyperlipidemia-triglycerides are mildly elevated at 190 and previously were 173  Plan: She is  medically stable should she wish to reconsider surgery. Return in 6 months. Continue same medications. Triglycerides are mildly elevated at 190  Subjective:   Patient presents for Medicare Annual/Subsequent preventive examination.  Review Past Medical/Family/Social:see  above   Risk Factors  Current exercise habits: cycling and swimming Dietary issues discussed: low fat/ low carb  Cardiac risk factors: Lipids  Depression Screen  (Note: if answer to either of the following is "Yes", a more complete depression screening is indicated)   Over the past two weeks, have you felt down, depressed or hopeless? No  Over the past two weeks, have you felt little interest or pleasure in doing things? No Have you lost interest or pleasure in daily life? No Do you often feel hopeless? No Do you cry easily over simple problems? No   Activities of Daily Living  In your present state of health, do you have any difficulty performing the following activities?:   Driving? No  Managing money? No  Feeding yourself? No  Getting from bed to chair? No  Climbing a flight of stairs? No  Preparing food and eating?: No  Bathing or showering? No  Getting dressed: No  Getting to the toilet? No  Using the toilet:No  Moving around from place to place: No  In the past year have you fallen or had a near fall?:No  Are you sexually active? No  Do you have more than one partner? No   Hearing Difficulties: No  Do you often ask people to speak up or repeat themselves? No  Do you experience ringing or noises in your ears? No  Do you have difficulty understanding soft or whispered voices? No  Do you feel that you have a problem with memory? No Do you often misplace items? No    Home Safety:  Do you have a smoke alarm at your residence? Yes Do you have grab bars in the bathroom?yes Do you have throw rugs in your house?no   Cognitive Testing  Alert? Yes Normal Appearance?Yes  Oriented to person? Yes Place? Yes  Time? Yes  Recall of three objects? Yes  Can perform simple calculations? Yes  Displays appropriate judgment?Yes  Can read the correct time from a watch face?Yes   List the Names of Other Physician/Practitioners you currently use:  See referral list for the  physicians patient is currently seeing.     Review of Systems: See above   Objective:     General appearance: Appears stated age and mildly obese  Head: Normocephalic, without obvious abnormality, atraumatic  Eyes: conj clear, EOMi PEERLA  Ears: normal TM's and external ear canals both ears  Nose: Nares normal. Septum midline. Mucosa normal. No drainage or sinus tenderness.  Throat: lips, mucosa, and tongue normal; teeth and gums normal  Neck: no adenopathy, no carotid bruit, no JVD, supple, symmetrical, trachea midline and thyroid not enlarged, symmetric, no tenderness/mass/nodules  No CVA tenderness.  Lungs: clear to auscultation bilaterally  Breasts: normal appearance, no masses or tenderness Heart: regular rate and rhythm, S1, S2 normal, no murmur, click, rub or gallop  Abdomen: soft, non-tender; bowel sounds normal; no masses, no organomegaly  Musculoskeletal: ROM normal in all joints, no crepitus, no deformity, Normal muscle strengthen. Back  is symmetric, no curvature. Skin: Skin color, texture, turgor normal. No rashes or lesions  Lymph nodes: Cervical, supraclavicular, and axillary nodes normal.  Neurologic: CN 2 -12 Normal, Normal symmetric reflexes. Normal coordination and gait  Psych: Alert & Oriented x 3, Mood appear  stable.    Assessment:    Annual wellness medicare exam   Plan:    During the course of the visit the patient was educated and counseled about appropriate screening and preventive services including:   Annual mammogram  Colonoscopy discussed  Needs pneumococcal 23     Patient Instructions (the written plan) was given to the patient.  Medicare Attestation  I have personally reviewed:  The patient's medical and social history  Their use of alcohol, tobacco or illicit drugs  Their current medications and supplements  The patient's functional ability including ADLs,fall risks, home safety risks, cognitive, and hearing and visual impairment   Diet and physical activities  Evidence for depression or mood disorders  The patient's weight, height, BMI, and visual acuity have been recorded in the chart. I have made referrals, counseling, and provided education to the patient based on review of the above and I have provided the patient with a written personalized care plan for preventive services.

## 2017-04-16 NOTE — Patient Instructions (Signed)
Patient wants to put off right shoulder surgery for some 6 months. Dr. Archie Endo office notified of this desire. She is to continue same medications and return in 6 months. She will watch her diet a bit more. Has been eating a lot of fruit.

## 2017-04-25 ENCOUNTER — Encounter: Payer: Self-pay | Admitting: Internal Medicine

## 2017-04-25 DIAGNOSIS — Z780 Asymptomatic menopausal state: Secondary | ICD-10-CM | POA: Diagnosis not present

## 2017-04-25 DIAGNOSIS — Z1231 Encounter for screening mammogram for malignant neoplasm of breast: Secondary | ICD-10-CM | POA: Diagnosis not present

## 2017-04-28 ENCOUNTER — Encounter: Payer: Self-pay | Admitting: Internal Medicine

## 2017-04-28 DIAGNOSIS — M19011 Primary osteoarthritis, right shoulder: Secondary | ICD-10-CM | POA: Insufficient documentation

## 2017-05-07 ENCOUNTER — Other Ambulatory Visit: Payer: Self-pay | Admitting: Internal Medicine

## 2017-05-17 ENCOUNTER — Encounter: Payer: Self-pay | Admitting: Internal Medicine

## 2017-05-17 DIAGNOSIS — E119 Type 2 diabetes mellitus without complications: Secondary | ICD-10-CM | POA: Diagnosis not present

## 2017-05-17 DIAGNOSIS — H25813 Combined forms of age-related cataract, bilateral: Secondary | ICD-10-CM | POA: Diagnosis not present

## 2017-05-17 DIAGNOSIS — H40013 Open angle with borderline findings, low risk, bilateral: Secondary | ICD-10-CM | POA: Diagnosis not present

## 2017-05-17 DIAGNOSIS — H04123 Dry eye syndrome of bilateral lacrimal glands: Secondary | ICD-10-CM | POA: Diagnosis not present

## 2017-05-17 LAB — HM DIABETES EYE EXAM

## 2017-05-29 DIAGNOSIS — D229 Melanocytic nevi, unspecified: Secondary | ICD-10-CM | POA: Diagnosis not present

## 2017-05-29 DIAGNOSIS — B360 Pityriasis versicolor: Secondary | ICD-10-CM | POA: Diagnosis not present

## 2017-05-29 DIAGNOSIS — L821 Other seborrheic keratosis: Secondary | ICD-10-CM | POA: Diagnosis not present

## 2017-08-24 ENCOUNTER — Other Ambulatory Visit: Payer: Self-pay | Admitting: Internal Medicine

## 2017-09-28 ENCOUNTER — Other Ambulatory Visit (INDEPENDENT_AMBULATORY_CARE_PROVIDER_SITE_OTHER): Payer: Medicare HMO | Admitting: Internal Medicine

## 2017-09-28 DIAGNOSIS — E785 Hyperlipidemia, unspecified: Secondary | ICD-10-CM

## 2017-09-28 DIAGNOSIS — E118 Type 2 diabetes mellitus with unspecified complications: Secondary | ICD-10-CM | POA: Diagnosis not present

## 2017-09-29 LAB — MICROALBUMIN / CREATININE URINE RATIO
CREATININE, URINE: 18 mg/dL — AB (ref 20–275)
Microalb Creat Ratio: 11 mcg/mg creat (ref <30)
Microalb, Ur: 0.2 mg/dL

## 2017-09-29 LAB — LIPID PANEL
CHOLESTEROL: 175 mg/dL (ref <200)
HDL: 52 mg/dL (ref >50)
LDL Cholesterol (Calc): 98 mg/dL (calc)
Non-HDL Cholesterol (Calc): 123 mg/dL (calc) (ref <130)
TRIGLYCERIDES: 151 mg/dL — AB (ref <150)
Total CHOL/HDL Ratio: 3.4 (calc) (ref <5.0)

## 2017-09-29 LAB — HEPATIC FUNCTION PANEL
AG Ratio: 1.8 (calc) (ref 1.0–2.5)
ALKALINE PHOSPHATASE (APISO): 59 U/L (ref 33–130)
ALT: 7 U/L (ref 6–29)
AST: 20 U/L (ref 10–35)
Albumin: 4.5 g/dL (ref 3.6–5.1)
BILIRUBIN INDIRECT: 0.8 mg/dL (ref 0.2–1.2)
Bilirubin, Direct: 0.1 mg/dL (ref 0.0–0.2)
Globulin: 2.5 g/dL (calc) (ref 1.9–3.7)
TOTAL PROTEIN: 7 g/dL (ref 6.1–8.1)
Total Bilirubin: 0.9 mg/dL (ref 0.2–1.2)

## 2017-09-29 LAB — HEMOGLOBIN A1C
EAG (MMOL/L): 8.4 (calc)
HEMOGLOBIN A1C: 6.9 %{Hb} — AB (ref <5.7)
Mean Plasma Glucose: 151 (calc)

## 2017-10-04 ENCOUNTER — Other Ambulatory Visit: Payer: Commercial Managed Care - HMO | Admitting: Internal Medicine

## 2017-10-08 ENCOUNTER — Ambulatory Visit: Payer: Commercial Managed Care - HMO | Admitting: Internal Medicine

## 2017-10-17 ENCOUNTER — Encounter: Payer: Self-pay | Admitting: Internal Medicine

## 2017-10-17 ENCOUNTER — Telehealth: Payer: Self-pay | Admitting: Internal Medicine

## 2017-10-17 ENCOUNTER — Ambulatory Visit (INDEPENDENT_AMBULATORY_CARE_PROVIDER_SITE_OTHER): Payer: Medicare HMO | Admitting: Internal Medicine

## 2017-10-17 VITALS — BP 130/80 | HR 93 | Ht 62.0 in | Wt 153.0 lb

## 2017-10-17 DIAGNOSIS — E118 Type 2 diabetes mellitus with unspecified complications: Secondary | ICD-10-CM

## 2017-10-17 DIAGNOSIS — E039 Hypothyroidism, unspecified: Secondary | ICD-10-CM | POA: Diagnosis not present

## 2017-10-17 DIAGNOSIS — E785 Hyperlipidemia, unspecified: Secondary | ICD-10-CM | POA: Diagnosis not present

## 2017-10-17 DIAGNOSIS — M75101 Unspecified rotator cuff tear or rupture of right shoulder, not specified as traumatic: Secondary | ICD-10-CM

## 2017-10-17 MED ORDER — METFORMIN HCL 500 MG PO TABS: ORAL_TABLET | ORAL | 3 refills | Status: DC

## 2017-10-17 MED ORDER — LEVOTHYROXINE SODIUM 50 MCG PO TABS: 50.0000 ug | ORAL_TABLET | Freq: Every day | ORAL | 3 refills | Status: DC

## 2017-10-17 NOTE — Telephone Encounter (Addendum)
Patient wants to know if you will write her a Rx for massage therapy that she can take with her to have a massage so that her insurance will pay for it.  She skipped surgery on her shoulder and has been doing exercises.  She has never tried a massage before, but she is going to try a massage and see if this will help her shoulder, arms and lower back.  And, she thinks if she has a prescription for it, her insurance may help to pay a portion of it.    I told her we would mail it to her so we wouldn't hold her up since you were in with another patient.    She also needs refills sent to her Mail order pharmacy on her medications.   She needs Lancets sent to [facility] [place] (because she's almost out of those).    Thank you.

## 2017-10-17 NOTE — Progress Notes (Signed)
   Subjective:    Patient ID: Paige Ali, female    DOB: [birth year], 71 y.o.   MRN: [id number]  HPI 71 year old Female in today for 69-month recheck on diabetes mellitus, hypothyroidism and hyperlipidemia.  She continues to eat carefully and exercise regularly which is marvelous.  She feels well except for some musculoskeletal pain in back and right arm and shoulder.  She is asking about massage versus physical therapy.  I think she could benefit from massage therapy and we will write a prescription for that.  I am not sure it is covered by her insurance plan.    Review of Systems     Objective:   Physical Exam  Constitutional: She is oriented to person, place, and time. She appears well-developed and well-nourished. No distress.  Neck: Neck supple. No JVD present. No thyromegaly present.  Cardiovascular: Normal rate, normal heart sounds and intact distal pulses.  No murmur heard. Pulmonary/Chest: Effort normal and breath sounds normal. No respiratory distress. She has no wheezes. She has no rales.  Musculoskeletal: She exhibits no edema.  Lymphadenopathy:    She has no cervical adenopathy.  Neurological: She is alert and oriented to person, place, and time.  Skin: Skin is warm and dry. She is not diaphoretic.  Psychiatric: She has a normal mood and affect. Her behavior is normal. Judgment and thought content normal.  Vitals reviewed.         Assessment & Plan:  Hyperlipidemia-stable on Lipitor 40 mg daily  Hypothyroidism-TSH last checked in June and was stable on current dose of thyroid replacement  Diabetes mellitus-she watches glucose regularly and watches her diet and exercises frequently.  She swims at least once a week.  Hemoglobin A1c excellent at 6.9%.  Liver functions are normal.  Lipid panel within normal limits.  Musculoskeletal pain arm, shoulder, and back.  She prefers conservative treatment.  Plan: Continue present medications.  Continue diet and exercise  and return in 6 months for physical exam.  Order written for massage therapy for musculoskeletal pain

## 2017-10-17 NOTE — Patient Instructions (Signed)
Order written for massage therapy for musculoskeletal pain.  Labs reviewed and are within normal limits.  Continue same medications and return in 6 months.

## 2017-10-17 NOTE — Telephone Encounter (Signed)
Escribed

## 2017-10-17 NOTE — Telephone Encounter (Signed)
Please refill her meds x one year as requested. Rx for massage therapy written and Sharyn Lull will mail that

## 2017-11-08 ENCOUNTER — Encounter: Payer: Self-pay | Admitting: Internal Medicine

## 2017-11-08 ENCOUNTER — Ambulatory Visit (INDEPENDENT_AMBULATORY_CARE_PROVIDER_SITE_OTHER): Payer: Medicare HMO | Admitting: Internal Medicine

## 2017-11-08 VITALS — BP 140/80 | HR 88 | Temp 98.0°F | Ht 62.0 in | Wt 153.0 lb

## 2017-11-08 DIAGNOSIS — J069 Acute upper respiratory infection, unspecified: Secondary | ICD-10-CM | POA: Diagnosis not present

## 2017-11-08 MED ORDER — AZITHROMYCIN 250 MG PO TABS: ORAL_TABLET | ORAL | 0 refills | Status: DC

## 2017-11-08 MED ORDER — BENZONATATE 100 MG PO CAPS: 100.0000 mg | ORAL_CAPSULE | Freq: Three times a day (TID) | ORAL | 1 refills | Status: DC | PRN

## 2017-11-08 NOTE — Progress Notes (Signed)
   Subjective:    Patient ID: Paige Ali, female    DOB: 07-31-1946, 72 y.o.   MRN: [id number]  HPI 72 year old Female with history of diabetes mellitus and hyperlipidemia.  Her daughter was here from [place] for a few days and had a upper respiratory infection.  Patient has come down with similar illness with cough and congestion.  Slight discolored sputum production.  No documented fever or shaking chills.  Sounds hoarse and congested.    Review of Systems denies sore throat or earache     Objective:   Physical Exam Skin warm and dry.  Nodes none.  Pharynx is injected without exudate.  TMs are clear.  Neck is supple without adenopathy.  Chest clear to auscultation.       Assessment & Plan:  Acute URI  Plan: Tessalon Perles 100 mg 3 times a day as needed for cough and congestion.  Zithromax Z-Pak take 2 tablets day 1 followed by 1 tablet days 2 through 5.

## 2017-11-08 NOTE — Patient Instructions (Signed)
Take Zithromax Z-Pak as directed 2 p.o. day 1 followed by 1 p.o. days 2 through 5.  Tessalon Perles 1 p.o. 3 times daily as needed for cough.  Rest and drink plenty of fluids.

## 2017-11-26 ENCOUNTER — Telehealth: Payer: Self-pay

## 2017-11-26 DIAGNOSIS — R05 Cough: Secondary | ICD-10-CM

## 2017-11-26 DIAGNOSIS — R059 Cough, unspecified: Secondary | ICD-10-CM

## 2017-11-26 NOTE — Telephone Encounter (Signed)
Please call this pt. and see what we can do.

## 2017-11-26 NOTE — Telephone Encounter (Signed)
Patient called states she's still coughing she said the cough had stopped for a week but it's back now and would like to know if you can send her in something to her pharmacy.

## 2017-11-26 NOTE — Telephone Encounter (Signed)
Called patient to schedule an appt, she said she doesn't think this is a "big deal" and she can not come in tomorrow or go for an xray.

## 2017-11-26 NOTE — Telephone Encounter (Signed)
See tomorrow and have CXR prior to coming to office

## 2017-11-27 MED ORDER — ACCU-CHEK SOFTCLIX LANCETS MISC: 3 refills | Status: DC

## 2017-11-27 MED ORDER — BENZONATATE 100 MG PO CAPS: 100.0000 mg | ORAL_CAPSULE | Freq: Three times a day (TID) | ORAL | 0 refills | Status: DC | PRN

## 2017-11-27 NOTE — Addendum Note (Signed)
Addended by: Mady Haagensen on: 11/27/2017 12:56 PM   Modules accepted: Orders

## 2017-11-27 NOTE — Telephone Encounter (Signed)
Refill Tessalon perles and let her know this

## 2017-11-27 NOTE — Telephone Encounter (Signed)
Spoke with patient and she advised that she had rode her bike on Sunday and she's feeling fine.  No fever, no production of sputum.  She is really just wanting some Tessalon Perles to have on hand prn for if/when she has a coughing spell.  However, she has no active disease going on at this time.  States she is doing better and she's feeling fine.  Explained your concern is that there is a lot of respiratory infections going around that are lasting several weeks and that's why you wanted her to come in to listen to her lungs and to see if she had any active disease going on with the CXR.  However, she denies any active disease going on at this time.  States she feels fine and does not need to come in.  She just wanted the Perles to have on hand IF she needed them.

## 2017-12-12 ENCOUNTER — Other Ambulatory Visit: Payer: Self-pay

## 2017-12-12 MED ORDER — GLUCOSE BLOOD VI STRP: ORAL_STRIP | 3 refills | Status: DC

## 2018-02-06 ENCOUNTER — Other Ambulatory Visit: Payer: Self-pay

## 2018-02-06 MED ORDER — ACCU-CHEK AVIVA PLUS W/DEVICE KIT: PACK | 0 refills | Status: DC

## 2018-02-06 NOTE — Telephone Encounter (Signed)
patient called states her glucose meter broke and she needs a new one sent to [facility].

## 2018-02-19 ENCOUNTER — Telehealth: Payer: Self-pay | Admitting: Internal Medicine

## 2018-02-19 NOTE — Telephone Encounter (Signed)
Paige Ali Self [phone]  Franco Collet called to say she has had a cough last week and this week it seems to be getting worse. It keep her up a lot last night. She has taken some medicine, that she had left over, but it does not seem to be helping.

## 2018-02-19 NOTE — Telephone Encounter (Signed)
She needs OV tomorrow.

## 2018-02-20 ENCOUNTER — Encounter: Payer: Self-pay | Admitting: Internal Medicine

## 2018-02-20 ENCOUNTER — Ambulatory Visit (INDEPENDENT_AMBULATORY_CARE_PROVIDER_SITE_OTHER): Payer: Medicare HMO | Admitting: Internal Medicine

## 2018-02-20 VITALS — BP 120/90 | HR 82 | Temp 97.9°F | Ht 62.0 in | Wt 150.0 lb

## 2018-02-20 DIAGNOSIS — J22 Unspecified acute lower respiratory infection: Secondary | ICD-10-CM | POA: Diagnosis not present

## 2018-02-20 DIAGNOSIS — H6691 Otitis media, unspecified, right ear: Secondary | ICD-10-CM | POA: Diagnosis not present

## 2018-02-20 DIAGNOSIS — R05 Cough: Secondary | ICD-10-CM | POA: Diagnosis not present

## 2018-02-20 DIAGNOSIS — R059 Cough, unspecified: Secondary | ICD-10-CM

## 2018-02-20 MED ORDER — AZITHROMYCIN 250 MG PO TABS: ORAL_TABLET | ORAL | 0 refills | Status: DC

## 2018-02-20 MED ORDER — BENZONATATE 100 MG PO CAPS: 100.0000 mg | ORAL_CAPSULE | Freq: Three times a day (TID) | ORAL | 2 refills | Status: DC | PRN

## 2018-02-20 MED ORDER — METHYLPREDNISOLONE ACETATE 80 MG/ML IJ SUSP
80.0000 mg | Freq: Once | INTRAMUSCULAR | Status: AC
  Administered 2018-02-20: 80 mg via INTRAMUSCULAR

## 2018-02-20 NOTE — Progress Notes (Signed)
   Subjective:    Patient ID: Paige Ali, female    DOB: Dec 28, 1945, 72 y.o.   MRN: [id number]  HPI 72 year old Female in today with cough for at least 2 weeks.  Says it started after she was swimming in cold water.  Subsequently she is been to visit her daughter in [place] and has continued coughing.  No fever or shaking chills and no sputum production.  Has had some upper respiratory congestion.  Also saw a dentist recently and was placed on amoxicillin week before last which she took as prescribed but it did not help her symptoms.  She is been taking Best boy for cough.  Cough is bothersome throughout the day.    Review of Systems see above     Objective:   Physical Exam Skin warm and dry.  No cervical adenopathy.  Pharynx is clear.  Right TM is full and pink.  Left TM is clear.  Neck is supple.  Chest clear to auscultation without rales or wheezing.  She does have a lot of nasal sniffling.       Assessment & Plan:  Acute lower respiratory infection  Acute right otitis media  Cough  Plan: Depo-Medrol 80 mg IM.  Zithromax Z-Pak take 2 tablets day 1 followed by 1 tablet days 2 through 5.  Tessalon Perles 100 mg 2 by mouth 3 times a day as needed for cough.

## 2018-02-20 NOTE — Patient Instructions (Addendum)
Depo-Medrol 80 mg IM.  Zithromax Z-Pak take 2 tablets p.o. day 1 tablet followed by 1 p.o. days 2 through 5.  Tessalon Perles as needed for cough-- 2 by mouth 3 times a day as needed.

## 2018-04-10 ENCOUNTER — Other Ambulatory Visit: Payer: Self-pay

## 2018-04-10 DIAGNOSIS — E785 Hyperlipidemia, unspecified: Secondary | ICD-10-CM

## 2018-04-10 DIAGNOSIS — Z0000 Encounter for general adult medical examination without abnormal findings: Secondary | ICD-10-CM

## 2018-04-10 DIAGNOSIS — E039 Hypothyroidism, unspecified: Secondary | ICD-10-CM

## 2018-04-10 DIAGNOSIS — E119 Type 2 diabetes mellitus without complications: Secondary | ICD-10-CM

## 2018-04-26 ENCOUNTER — Encounter: Payer: Self-pay | Admitting: Internal Medicine

## 2018-04-26 DIAGNOSIS — Z1231 Encounter for screening mammogram for malignant neoplasm of breast: Secondary | ICD-10-CM | POA: Diagnosis not present

## 2018-04-29 ENCOUNTER — Other Ambulatory Visit: Payer: Medicare HMO | Admitting: Internal Medicine

## 2018-04-29 DIAGNOSIS — E119 Type 2 diabetes mellitus without complications: Secondary | ICD-10-CM | POA: Diagnosis not present

## 2018-04-29 DIAGNOSIS — E039 Hypothyroidism, unspecified: Secondary | ICD-10-CM | POA: Diagnosis not present

## 2018-04-29 DIAGNOSIS — E785 Hyperlipidemia, unspecified: Secondary | ICD-10-CM | POA: Diagnosis not present

## 2018-04-29 DIAGNOSIS — Z0000 Encounter for general adult medical examination without abnormal findings: Secondary | ICD-10-CM | POA: Diagnosis not present

## 2018-04-30 LAB — CBC WITH DIFFERENTIAL/PLATELET
Basophils Absolute: 39 cells/uL (ref 0–200)
Basophils Relative: 0.6 %
EOS ABS: 338 {cells}/uL (ref 15–500)
EOS PCT: 5.2 %
HEMATOCRIT: 38.2 % (ref 35.0–45.0)
HEMOGLOBIN: 12.6 g/dL (ref 11.7–15.5)
LYMPHS ABS: 3062 {cells}/uL (ref 850–3900)
MCH: 30.7 pg (ref 27.0–33.0)
MCHC: 33 g/dL (ref 32.0–36.0)
MCV: 92.9 fL (ref 80.0–100.0)
MPV: 9.7 fL (ref 7.5–12.5)
Monocytes Relative: 8.2 %
NEUTROS ABS: 2529 {cells}/uL (ref 1500–7800)
NEUTROS PCT: 38.9 %
Platelets: 263 10*3/uL (ref 140–400)
RBC: 4.11 10*6/uL (ref 3.80–5.10)
RDW: 13 % (ref 11.0–15.0)
Total Lymphocyte: 47.1 %
WBC: 6.5 10*3/uL (ref 3.8–10.8)
WBCMIX: 533 {cells}/uL (ref 200–950)

## 2018-04-30 LAB — COMPLETE METABOLIC PANEL WITH GFR
AG RATIO: 1.7 (calc) (ref 1.0–2.5)
ALBUMIN MSPROF: 4.6 g/dL (ref 3.6–5.1)
ALKALINE PHOSPHATASE (APISO): 57 U/L (ref 33–130)
ALT: 5 U/L — ABNORMAL LOW (ref 6–29)
AST: 17 U/L (ref 10–35)
BUN: 20 mg/dL (ref 7–25)
CO2: 25 mmol/L (ref 20–32)
Calcium: 9.6 mg/dL (ref 8.6–10.4)
Chloride: 102 mmol/L (ref 98–110)
Creat: 0.84 mg/dL (ref 0.60–0.93)
GFR, EST NON AFRICAN AMERICAN: 70 mL/min/{1.73_m2} (ref >=60)
GFR, Est African American: 82 mL/min/{1.73_m2} (ref >=60)
GLOBULIN: 2.7 g/dL (ref 1.9–3.7)
GLUCOSE: 124 mg/dL — AB (ref 65–99)
POTASSIUM: 4.6 mmol/L (ref 3.5–5.3)
SODIUM: 138 mmol/L (ref 135–146)
TOTAL PROTEIN: 7.3 g/dL (ref 6.1–8.1)
Total Bilirubin: 1.1 mg/dL (ref 0.2–1.2)

## 2018-04-30 LAB — LIPID PANEL
Cholesterol: 205 mg/dL — ABNORMAL HIGH (ref <200)
HDL: 57 mg/dL (ref >50)
LDL Cholesterol (Calc): 122 mg/dL (calc) — ABNORMAL HIGH
Non-HDL Cholesterol (Calc): 148 mg/dL (calc) — ABNORMAL HIGH (ref <130)
TRIGLYCERIDES: 149 mg/dL (ref <150)
Total CHOL/HDL Ratio: 3.6 (calc) (ref <5.0)

## 2018-04-30 LAB — HEMOGLOBIN A1C
EAG (MMOL/L): 8.5 (calc)
Hgb A1c MFr Bld: 7 % of total Hgb — ABNORMAL HIGH (ref <5.7)
MEAN PLASMA GLUCOSE: 154 (calc)

## 2018-04-30 LAB — MICROALBUMIN / CREATININE URINE RATIO
CREATININE, URINE: 69 mg/dL (ref 20–275)
MICROALB UR: 0.4 mg/dL
MICROALB/CREAT RATIO: 6 ug/mg{creat} (ref <30)

## 2018-04-30 LAB — TSH: TSH: 2.43 m[IU]/L (ref 0.40–4.50)

## 2018-05-06 ENCOUNTER — Ambulatory Visit (INDEPENDENT_AMBULATORY_CARE_PROVIDER_SITE_OTHER): Payer: Medicare HMO | Admitting: Internal Medicine

## 2018-05-06 ENCOUNTER — Encounter: Payer: Self-pay | Admitting: Internal Medicine

## 2018-05-06 VITALS — BP 120/70 | HR 74 | Temp 99.3°F | Ht 62.5 in | Wt 150.0 lb

## 2018-05-06 DIAGNOSIS — R8290 Unspecified abnormal findings in urine: Secondary | ICD-10-CM | POA: Diagnosis not present

## 2018-05-06 DIAGNOSIS — Z23 Encounter for immunization: Secondary | ICD-10-CM

## 2018-05-06 DIAGNOSIS — E119 Type 2 diabetes mellitus without complications: Secondary | ICD-10-CM

## 2018-05-06 DIAGNOSIS — E039 Hypothyroidism, unspecified: Secondary | ICD-10-CM

## 2018-05-06 DIAGNOSIS — Z0000 Encounter for general adult medical examination without abnormal findings: Secondary | ICD-10-CM | POA: Diagnosis not present

## 2018-05-06 DIAGNOSIS — E7800 Pure hypercholesterolemia, unspecified: Secondary | ICD-10-CM

## 2018-05-06 LAB — POCT URINALYSIS DIPSTICK
Bilirubin, UA: NEGATIVE
Glucose, UA: NEGATIVE
KETONES UA: NEGATIVE
NITRITE UA: NEGATIVE
PH UA: 6 (ref 5.0–8.0)
PROTEIN UA: POSITIVE — AB
SPEC GRAV UA: 1.015 (ref 1.010–1.025)
UROBILINOGEN UA: 0.2 U/dL

## 2018-05-06 NOTE — Progress Notes (Signed)
Subjective:    Patient ID: Paige Ali, female    DOB: 1946/06/20, 72 y.o.   MRN: [id number]  HPI 72 year old Female in today for Medicare Wellness, routine health maintenance, and evaluation of medical issues including hypothyroidism, glucose intolerance, and hyperlipidemia general health is good. Just got back from a 3-week trip to her home country of [place] as well as [place].  May have eaten too many sweets there.  History of right shoulder arthropathy and has seen orthopedist in the past.  History of hyperlipidemia and impaired glucose tolerance.  Continues to exercise regularly.  History of adult onset diabetes for which she takes metformin.  She takes Lipitor for hyperlipidemia.  Old records indicate she became diabetic around 2006.  Social history: She is a native of [place] and has been in the [place] now for about 17 years.  She has a college degree.  She is a widow.  Does not smoke.  Daughter lives in [place], [place].  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  Her son also lives here and has a coffee business called [employer].  She does not smoke.  Social alcohol consumption.  She swims at the [employer] and rides her bicycle.  Family history: Father passed away at nearly 58 years of age and dictations of a leg infection.  He lives in [place].  Mother with history of diabetes still living.  2 brothers and 2 sisters that are healthy.    Review of Systems no new complaints     Objective:   Physical Exam  Constitutional: She is oriented to person, place, and time. She appears well-developed and well-nourished. No distress.  HENT:  Head: Normocephalic and atraumatic.  Right Ear: External ear normal.  Left Ear: External ear normal.  Nose: Nose normal.  Mouth/Throat: Oropharynx is clear and moist.  Eyes: EOM are normal. Right eye exhibits no discharge. Left eye exhibits no discharge. No scleral icterus.  Neck: No tracheal deviation present. No thyromegaly  present.  Cardiovascular: Normal rate, regular rhythm, normal heart sounds and intact distal pulses. Exam reveals no friction rub.  No murmur heard. Pulmonary/Chest: Effort normal and breath sounds normal. No stridor. No respiratory distress. She has no wheezes. She has no rales.  Abdominal: Soft. Bowel sounds are normal. She exhibits no distension and no mass. There is no tenderness. There is no rebound and no guarding. No hernia.  Genitourinary:  Genitourinary Comments: Pap deferred due to age.  Bimanual normal.  Musculoskeletal: She exhibits no edema or deformity.  Lymphadenopathy:    She has no cervical adenopathy.  Neurological: She is alert and oriented to person, place, and time. She displays normal reflexes. No cranial nerve deficit or sensory deficit. She exhibits normal muscle tone. Coordination normal.  Skin: Skin is warm and dry. She is not diaphoretic. No erythema.  Psychiatric: She has a normal mood and affect. Her behavior is normal. Judgment and thought content normal.  Vitals reviewed.         Assessment & Plan:  Hypothyroidism-TSH is within normal limits.  Continue same dose of thyroid replacement  Hyperlipidemia-total cholesterol 205 with an LDL cholesterol of 122.  Last year total cholesterol and LDL were normal.  Triglycerides are normal.  She needs to work on diet and exercise.  Probably ate too much on her trip to [place].  Controlled type 2 diabetes mellitus without complication.  Hemoglobin A1c 7% and 7 months ago was 6.9%  Plan: She will work on diet and exercise and  follow-up with me in 6 months.  Reminded about diabetic eye exam.  Subjective:   Patient presents for Medicare Annual/Subsequent preventive examination.  Review Past Medical/Family/Social: See above   Risk Factors  Current exercise habits: Exercises regularly with swimming and bike riding.  Works every day and coffee shop except  Sundays Dietary issues discussed: Low-fat low  carbohydrate  Cardiac risk factors: Diabetes mellitus  Depression Screen  (Note: if answer to either of the following is "Yes", a more complete depression screening is indicated)   Over the past two weeks, have you felt down, depressed or hopeless? No  Over the past two weeks, have you felt little interest or pleasure in doing things? No Have you lost interest or pleasure in daily life? No Do you often feel hopeless? No Do you cry easily over simple problems? No   Activities of Daily Living  In your present state of health, do you have any difficulty performing the following activities?:   Driving? No  Managing money? No  Feeding yourself? No  Getting from bed to chair? No  Climbing a flight of stairs? No  Preparing food and eating?: No  Bathing or showering? No  Getting dressed: No  Getting to the toilet? No  Using the toilet:No  Moving around from place to place: No  In the past year have you fallen or had a near fall?:No  Are you sexually active? No  Do you have more than one partner? No   Hearing Difficulties: No  Do you often ask people to speak up or repeat themselves? No  Do you experience ringing or noises in your ears? No  Do you have difficulty understanding soft or whispered voices? No  Do you feel that you have a problem with memory? No Do you often misplace items? No    Home Safety:  Do you have a smoke alarm at your residence? Yes Do you have grab bars in the bathroom?  Yes Do you have throw rugs in your house?  No   Cognitive Testing  Alert? Yes Normal Appearance?Yes  Oriented to person? Yes Place? Yes  Time? Yes  Recall of three objects? Yes  Can perform simple calculations? Yes  Displays appropriate judgment?Yes  Can read the correct time from a watch face?Yes   List the Names of Other Physician/Practitioners you currently use:  See referral list for the physicians patient is currently seeing.     Review of Systems: See above   Objective:      General appearance: Appears stated age and mildly obese  Head: Normocephalic, without obvious abnormality, atraumatic  Eyes: conj clear, EOMi PEERLA  Ears: normal TM's and external ear canals both ears  Nose: Nares normal. Septum midline. Mucosa normal. No drainage or sinus tenderness.  Throat: lips, mucosa, and tongue normal; teeth and gums normal  Neck: no adenopathy, no carotid bruit, no JVD, supple, symmetrical, trachea midline and thyroid not enlarged, symmetric, no tenderness/mass/nodules  No CVA tenderness.  Lungs: clear to auscultation bilaterally  Breasts: normal appearance, no masses or tenderness Heart: regular rate and rhythm, S1, S2 normal, no murmur, click, rub or gallop  Abdomen: soft, non-tender; bowel sounds normal; no masses, no organomegaly  Musculoskeletal: ROM normal in all joints, no crepitus, no deformity, Normal muscle strengthen. Back  is symmetric, no curvature. Skin: Skin color, texture, turgor normal. No rashes or lesions  Lymph nodes: Cervical, supraclavicular, and axillary nodes normal.  Neurologic: CN 2 -12 Normal, Normal symmetric reflexes. Normal coordination and  gait  Psych: Alert & Oriented x 3, Mood appear stable.    Assessment:    Annual wellness medicare exam   Plan:    During the course of the visit the patient was educated and counseled about appropriate screening and preventive services including:    Annual diabetic eye exam  Annual flu vaccine   Annual mammogram    Patient Instructions (the written plan) was given to the patient.  Medicare Attestation  I have personally reviewed:  The patient's medical and social history  Their use of alcohol, tobacco or illicit drugs  Their current medications and supplements  The patient's functional ability including ADLs,fall risks, home safety risks, cognitive, and hearing and visual impairment  Diet and physical activities  Evidence for depression or mood disorders  The patient's  weight, height, BMI, and visual acuity have been recorded in the chart. I have made referrals, counseling, and provided education to the patient based on review of the above and I have provided the patient with a written personalized care plan for preventive services.

## 2018-05-06 NOTE — Patient Instructions (Signed)
Continue to exercise.  Please watch diet.  Pneumococcal 23 vaccine given.  Return in 6 months.

## 2018-05-08 LAB — URINE CULTURE
MICRO NUMBER: 90810663
SPECIMEN QUALITY: ADEQUATE

## 2018-07-16 ENCOUNTER — Other Ambulatory Visit: Payer: Self-pay | Admitting: Internal Medicine

## 2018-08-09 ENCOUNTER — Ambulatory Visit (INDEPENDENT_AMBULATORY_CARE_PROVIDER_SITE_OTHER): Payer: Medicare HMO | Admitting: Internal Medicine

## 2018-08-09 ENCOUNTER — Encounter: Payer: Self-pay | Admitting: Internal Medicine

## 2018-08-09 VITALS — BP 142/80 | HR 92 | Temp 98.3°F | Ht 62.5 in | Wt 151.0 lb

## 2018-08-09 DIAGNOSIS — R350 Frequency of micturition: Secondary | ICD-10-CM

## 2018-08-09 DIAGNOSIS — R8290 Unspecified abnormal findings in urine: Secondary | ICD-10-CM

## 2018-08-09 DIAGNOSIS — N3090 Cystitis, unspecified without hematuria: Secondary | ICD-10-CM

## 2018-08-09 MED ORDER — CIPROFLOXACIN HCL 250 MG PO TABS: 250.0000 mg | ORAL_TABLET | Freq: Two times a day (BID) | ORAL | 0 refills | Status: DC

## 2018-08-09 NOTE — Progress Notes (Signed)
   Subjective:    Patient ID: Paige Ali, female    DOB: [birth year], 72 y.o.   MRN: [id number]  HPI 72 year old Female who recently went horseback riding.  Subsequently a few days later developed significant urinary frequency.  This went on for a couple of days.  At one point she thought the color of her urine changed and thought it was a bit pinkish.  Denies significant back pain, no nausea vomiting or symptoms of renal colic.  Had significant urinary frequency 1 maybe 2 evenings without significant dysuria.  No fever or shaking chills. Now beginning to feel better.  Improvement was fairly sudden.  She did not take any antibiotics.  Urine dipstick today shows small LE and small occult blood.  Urine was sent for culture.   Review of Systems see above     Objective:   Physical Exam No CVA tenderness.  Currently in no acute distress.  See lab for dipstick UA results.  Patient is afebrile.  Vital signs reviewed.       Assessment & Plan:  She could have a either an acute urinary tract infection or perhaps she passed a small kidney stone.  She is feeling better but is concerned about the symptoms that she had.  Urine culture is pending.  Have elected to treat her with Cipro 250 mg twice daily for 3 days.  15 minutes spent with patient

## 2018-08-09 NOTE — Patient Instructions (Signed)
Urine culture pending.  Take Cipro 250 mg twice daily for 3 days.

## 2018-08-11 LAB — URINE CULTURE
MICRO NUMBER: 91225733
Result:: NO GROWTH
SPECIMEN QUALITY: ADEQUATE

## 2018-08-12 ENCOUNTER — Telehealth: Payer: Self-pay | Admitting: Internal Medicine

## 2018-08-12 LAB — POCT URINALYSIS DIPSTICK
Bilirubin, UA: NEGATIVE
Glucose, UA: NEGATIVE
Ketones, UA: NEGATIVE
NITRITE UA: NEGATIVE
PH UA: 6 (ref 5.0–8.0)
PROTEIN UA: NEGATIVE
Spec Grav, UA: 1.01 (ref 1.010–1.025)
UROBILINOGEN UA: 0.2 U/dL

## 2018-08-12 MED ORDER — ATORVASTATIN CALCIUM 20 MG PO TABS: 20.0000 mg | ORAL_TABLET | Freq: Every day | ORAL | 3 refills | Status: DC

## 2018-08-12 NOTE — Telephone Encounter (Signed)
Patient states that she is taking 20mg  of Lipitor.  She has been cutting the 40mg  in 1/2.  Since her insurance is paying for the medication, she would like to just have the 20mg  called to her pharmacy so she no longer has to cut the pills in half.    Pharmacy:  [facility]  Phone:  [phone]

## 2018-08-12 NOTE — Telephone Encounter (Signed)
Done

## 2018-08-12 NOTE — Telephone Encounter (Signed)
Please send in 20 mg Lipitor #90 with 3 refills to mail order pharmacy

## 2018-09-13 ENCOUNTER — Encounter: Payer: Self-pay | Admitting: Gastroenterology

## 2018-10-09 ENCOUNTER — Other Ambulatory Visit: Payer: Self-pay | Admitting: Internal Medicine

## 2018-10-09 ENCOUNTER — Ambulatory Visit (AMBULATORY_SURGERY_CENTER): Payer: Self-pay

## 2018-10-09 VITALS — Ht 61.5 in | Wt 152.2 lb

## 2018-10-09 DIAGNOSIS — Z1211 Encounter for screening for malignant neoplasm of colon: Secondary | ICD-10-CM

## 2018-10-09 MED ORDER — PEG 3350-KCL-NA BICARB-NACL 420 G PO SOLR: 4000.0000 mL | Freq: Once | ORAL | 0 refills | Status: AC

## 2018-10-09 NOTE — Progress Notes (Signed)
Per pt, no allergies to soy or egg products.Pt not taking any weight loss meds or using  O2 at home.  Pt refused emmi video.  During the PV, I spent over 45 minutes with the pt reviewing all the paperwork. I reviewed the prep instructions several times with the pt, but the pt seemed confused about the instructions. The pt states she will have her sister help her with the prep and call us back if she has questions.

## 2018-10-18 ENCOUNTER — Encounter: Payer: Self-pay | Admitting: Gastroenterology

## 2018-10-31 ENCOUNTER — Encounter: Payer: Self-pay | Admitting: Gastroenterology

## 2018-11-11 ENCOUNTER — Other Ambulatory Visit: Payer: Medicare HMO | Admitting: Internal Medicine

## 2018-11-14 ENCOUNTER — Ambulatory Visit (AMBULATORY_SURGERY_CENTER): Payer: Medicare HMO | Admitting: Gastroenterology

## 2018-11-14 ENCOUNTER — Encounter: Payer: Self-pay | Admitting: Gastroenterology

## 2018-11-14 VITALS — BP 133/62 | HR 69 | Temp 98.6°F | Resp 16 | Ht 61.5 in | Wt 152.2 lb

## 2018-11-14 DIAGNOSIS — D120 Benign neoplasm of cecum: Secondary | ICD-10-CM | POA: Diagnosis not present

## 2018-11-14 DIAGNOSIS — D122 Benign neoplasm of ascending colon: Secondary | ICD-10-CM | POA: Diagnosis not present

## 2018-11-14 DIAGNOSIS — E669 Obesity, unspecified: Secondary | ICD-10-CM | POA: Diagnosis not present

## 2018-11-14 DIAGNOSIS — Z1211 Encounter for screening for malignant neoplasm of colon: Secondary | ICD-10-CM | POA: Diagnosis not present

## 2018-11-14 DIAGNOSIS — E119 Type 2 diabetes mellitus without complications: Secondary | ICD-10-CM | POA: Diagnosis not present

## 2018-11-14 MED ORDER — SODIUM CHLORIDE 0.9 % IV SOLN: 500.0000 mL | Freq: Once | INTRAVENOUS | Status: DC

## 2018-11-14 NOTE — Progress Notes (Signed)
Report to PACU, RN, vss, BBS= Clear.  

## 2018-11-14 NOTE — Patient Instructions (Signed)
Please read handouts provided. Await pathology results. Continue present medications. No aspirin, ibuprofen, naproxen, or other non-steriodal anti-inflammatory drugs for 2 weeks.     YOU HAD AN ENDOSCOPIC PROCEDURE TODAY AT [facility]:   Refer to the procedure report that was given to you for any specific questions about what was found during the examination.  If the procedure report does not answer your questions, please call your gastroenterologist to clarify.  If you requested that your care partner not be given the details of your procedure findings, then the procedure report has been included in a sealed envelope for you to review at your convenience later.  YOU SHOULD EXPECT: Some feelings of bloating in the abdomen. Passage of more gas than usual.  Walking can help get rid of the air that was put into your GI tract during the procedure and reduce the bloating. If you had a lower endoscopy (such as a colonoscopy or flexible sigmoidoscopy) you may notice spotting of blood in your stool or on the toilet paper. If you underwent a bowel prep for your procedure, you may not have a normal bowel movement for a few days.  Please Note:  You might notice some irritation and congestion in your nose or some drainage.  This is from the oxygen used during your procedure.  There is no need for concern and it should clear up in a day or so.  SYMPTOMS TO REPORT IMMEDIATELY:   Following lower endoscopy (colonoscopy or flexible sigmoidoscopy):  Excessive amounts of blood in the stool  Significant tenderness or worsening of abdominal pains  Swelling of the abdomen that is new, acute  Fever of 100F or higher   For urgent or emergent issues, a gastroenterologist can be reached at any hour by calling [phone].   DIET:  We do recommend a small meal at first, but then you may proceed to your regular diet.  Drink plenty of fluids but you should avoid alcoholic beverages for 24  hours.  ACTIVITY:  You should plan to take it easy for the rest of today and you should NOT DRIVE or use heavy machinery until tomorrow (because of the sedation medicines used during the test).    FOLLOW UP: Our staff will call the number listed on your records the next business day following your procedure to check on you and address any questions or concerns that you may have regarding the information given to you following your procedure. If we do not reach you, we will leave a message.  However, if you are feeling well and you are not experiencing any problems, there is no need to return our call.  We will assume that you have returned to your regular daily activities without incident.  If any biopsies were taken you will be contacted by phone or by letter within the next 1-3 weeks.  Please call us at [phone] if you have not heard about the biopsies in 3 weeks.    SIGNATURES/CONFIDENTIALITY: You and/or your care partner have signed paperwork which will be entered into your electronic medical record.  These signatures attest to the fact that that the information above on your After Visit Summary has been reviewed and is understood.  Full responsibility of the confidentiality of this discharge information lies with you and/or your care-partner.

## 2018-11-14 NOTE — Progress Notes (Signed)
Pt's states no medical or surgical changes since previsit or office visit. 

## 2018-11-14 NOTE — Progress Notes (Signed)
Called to room to assist during endoscopic procedure.  Patient ID and intended procedure confirmed with present staff. Received instructions for my participation in the procedure from the performing physician.  

## 2018-11-14 NOTE — Op Note (Signed)
[facility] Patient Name: Paige Ali Procedure Date: 11/14/2018 7:58 AM MRN: [id number] Endoscopist: Mauri Pole , MD Age: 73 Referring MD:  Date of Birth: September 03, 1946 Gender: Female Account #: [id number] Procedure:                Colonoscopy Indications:              Screening for malignant neoplasm in the rectum,                            This is the patient's first colonoscopy Medicines:                Monitored Anesthesia Care Procedure:                Pre-Anesthesia Assessment:                           - Prior to the procedure, a History and Physical                            was performed, and patient medications and                            allergies were reviewed. The patient's tolerance of                            previous anesthesia was also reviewed. The risks                            and benefits of the procedure and the sedation                            options and risks were discussed with the patient.                            All questions were answered, and informed consent                            was obtained. Prior Anticoagulants: The patient has                            taken no previous anticoagulant or antiplatelet                            agents. ASA Grade Assessment: II - A patient with                            mild systemic disease. After reviewing the risks                            and benefits, the patient was deemed in                            satisfactory condition to undergo the procedure.  After obtaining informed consent, the colonoscope                            was passed under direct vision. Throughout the                            procedure, the patient's blood pressure, pulse, and                            oxygen saturations were monitored continuously. The                            Model PCF-[id number]) scope was introduced                            through the  anus and advanced to the the cecum,                            identified by appendiceal orifice and ileocecal                            valve. The colonoscopy was performed without                            difficulty. The patient tolerated the procedure                            well. The quality of the bowel preparation was                            excellent. The ileocecal valve, appendiceal                            orifice, and rectum were photographed. Scope In: 8:12:17 AM Scope Out: 8:31:07 AM Scope Withdrawal Time: 0 hours 15 minutes 49 seconds  Total Procedure Duration: 0 hours 18 minutes 50 seconds  Findings:                 The perianal and digital rectal examinations were                            normal.                           A 7 mm polyp was found in the ileocecal valve. The                            polyp was sessile. Polypectomy was attempted,                            initially using a cold snare. Polyp resection was                            incomplete with this device. This intervention then  required a different device and polypectomy                            technique. The polyp was removed with a cold biopsy                            forceps. Resection and retrieval were complete.                           Two sessile polyps were found in the ascending                            colon. The polyps were 7 to 11 mm in size. These                            polyps were removed with a cold snare. Resection                            and retrieval were complete.                           A few small and large-mouthed diverticula were                            found in the sigmoid colon and descending colon.                           Non-bleeding internal hemorrhoids were found during                            retroflexion. The hemorrhoids were small. Complications:            No immediate complications. Estimated Blood Loss:      Estimated blood loss was minimal. Impression:               - One 7 mm polyp at the ileocecal valve, removed                            with a cold biopsy forceps. Resected and retrieved.                           - Two 7 to 11 mm polyps in the ascending colon,                            removed with a cold snare. Resected and retrieved.                           - Diverticulosis in the sigmoid colon and in the                            descending colon.                           - Non-bleeding internal hemorrhoids. Recommendation:           -  Patient has a contact number available for                            emergencies. The signs and symptoms of potential                            delayed complications were discussed with the                            patient. Return to normal activities tomorrow.                            Written discharge instructions were provided to the                            patient.                           - Resume previous diet.                           - Continue present medications.                           - Await pathology results.                           - Repeat colonoscopy in 3 years for surveillance                            based on pathology results.                           - No aspirin, ibuprofen, naproxen, or other                            non-steroidal anti-inflammatory drugs for 2 weeks. Mauri Pole, MD 11/14/2018 8:39:47 AM This report has been signed electronically.

## 2018-11-15 ENCOUNTER — Other Ambulatory Visit: Payer: Medicare HMO | Admitting: Internal Medicine

## 2018-11-15 ENCOUNTER — Ambulatory Visit: Payer: Medicare HMO | Admitting: Internal Medicine

## 2018-11-15 ENCOUNTER — Telehealth: Payer: Self-pay

## 2018-11-15 NOTE — Telephone Encounter (Incomplete)
  Follow up Call-  Call back number 11/14/2018  Post procedure Call Back phone  # [phone] cell  Permission to leave phone message Yes  Some recent data might be hidden     Patient questions:  Do you have a fever, pain , or abdominal swelling? No. Pain Score  0 *  Have you tolerated food without any problems? Yes.    Have you been able to return to your normal activities? Yes.    Do you have any questions about your discharge instructions: Diet   No. Medications  No. Follow up visit  No.  Do you have questions or concerns about your Care? No.  Actions: * If pain score is 4 or above: {ACTION; [facility] ENDO PAIN >4:21563::"No action needed, pain <4."}

## 2018-11-18 ENCOUNTER — Other Ambulatory Visit: Payer: Medicare HMO | Admitting: Internal Medicine

## 2018-11-18 DIAGNOSIS — E7800 Pure hypercholesterolemia, unspecified: Secondary | ICD-10-CM | POA: Diagnosis not present

## 2018-11-18 DIAGNOSIS — E119 Type 2 diabetes mellitus without complications: Secondary | ICD-10-CM | POA: Diagnosis not present

## 2018-11-19 ENCOUNTER — Encounter: Payer: Self-pay | Admitting: Gastroenterology

## 2018-11-19 LAB — HEPATIC FUNCTION PANEL
AG Ratio: 1.9 (calc) (ref 1.0–2.5)
ALT: 6 U/L (ref 6–29)
AST: 21 U/L (ref 10–35)
Albumin: 4.6 g/dL (ref 3.6–5.1)
Alkaline phosphatase (APISO): 58 U/L (ref 33–130)
Bilirubin, Direct: 0.2 mg/dL (ref 0.0–0.2)
Globulin: 2.4 g/dL (calc) (ref 1.9–3.7)
Indirect Bilirubin: 0.7 mg/dL (calc) (ref 0.2–1.2)
Total Bilirubin: 0.9 mg/dL (ref 0.2–1.2)
Total Protein: 7 g/dL (ref 6.1–8.1)

## 2018-11-19 LAB — LIPID PANEL
Cholesterol: 166 mg/dL (ref <200)
HDL: 57 mg/dL (ref >50)
LDL Cholesterol (Calc): 85 mg/dL (calc)
NON-HDL CHOLESTEROL (CALC): 109 mg/dL (ref <130)
Total CHOL/HDL Ratio: 2.9 (calc) (ref <5.0)
Triglycerides: 139 mg/dL (ref <150)

## 2018-11-19 LAB — HEMOGLOBIN A1C
Hgb A1c MFr Bld: 6.8 % of total Hgb — ABNORMAL HIGH (ref <5.7)
Mean Plasma Glucose: 148 (calc)
eAG (mmol/L): 8.2 (calc)

## 2018-11-19 LAB — MICROALBUMIN / CREATININE URINE RATIO
Creatinine, Urine: 45 mg/dL (ref 20–275)
Microalb, Ur: <0.2 mg/dL

## 2018-11-22 ENCOUNTER — Ambulatory Visit (INDEPENDENT_AMBULATORY_CARE_PROVIDER_SITE_OTHER): Payer: Medicare HMO | Admitting: Internal Medicine

## 2018-11-22 ENCOUNTER — Encounter: Payer: Self-pay | Admitting: Internal Medicine

## 2018-11-22 VITALS — BP 120/80 | HR 77 | Temp 98.3°F | Ht 62.0 in | Wt 149.0 lb

## 2018-11-22 DIAGNOSIS — E7800 Pure hypercholesterolemia, unspecified: Secondary | ICD-10-CM

## 2018-11-22 DIAGNOSIS — E119 Type 2 diabetes mellitus without complications: Secondary | ICD-10-CM

## 2018-11-22 DIAGNOSIS — E039 Hypothyroidism, unspecified: Secondary | ICD-10-CM

## 2018-11-22 NOTE — Patient Instructions (Signed)
It was a pleasure to see you today.  Continue same medications and return in 6 months for physical exam.

## 2018-11-22 NOTE — Progress Notes (Signed)
   Subjective:    Patient ID: Paige Ali, female    DOB: 1946/02/20, 73 y.o.   MRN: [id number]  HPI 73 year old Female in today for follow-up on diabetes mellitus and hyperlipidemia.  Also has history of hypothyroidism and is on thyroid replacement therapy.  She has been traveling to [place] and to [facility] to see family and friends.  She looks well.  No new complaints.  Had recent colonoscopy with tubular adenomas found.  This was her first colonoscopy.  Takes metformin for diabetes mellitus.  Takes Lipitor for hyperlipidemia.  Lipid panel and liver functions are entirely normal.  Urine screening for microalbuminuria is negative.  Hemoglobin A1c is stable at 6.8% and previously was 7%.  TSH was not checked today  Review of Systems see above     Objective:   Physical Exam Neck is supple without JVD thyromegaly or carotid bruits.  Chest clear to auscultation.  Cardiac exam regular rate and rhythm normal S1 and S2.  Extremities without edema.       Assessment & Plan:  Controlled type 2 diabetes mellitus-stable on metformin  Hyperlipidemia-stable on statin  Hypothyroidism-on thyroid replacement therapy  Plan: Patient will return in 6 months for physical examination.  We will continue same medications.  She exercises regularly and watches her weight.

## 2018-12-28 ENCOUNTER — Other Ambulatory Visit: Payer: Self-pay | Admitting: Internal Medicine

## 2019-03-17 ENCOUNTER — Telehealth: Payer: Self-pay | Admitting: Internal Medicine

## 2019-03-17 MED ORDER — ACCU-CHEK SOFTCLIX LANCETS MISC: 3 refills | Status: DC

## 2019-03-17 NOTE — Telephone Encounter (Signed)
Received Fax RX request from  [facility] - 11-22-18  Last CPE -05-06-18  Next CPE - 05-12-19

## 2019-05-09 ENCOUNTER — Other Ambulatory Visit: Payer: Self-pay

## 2019-05-09 ENCOUNTER — Other Ambulatory Visit: Payer: Medicare HMO | Admitting: Internal Medicine

## 2019-05-09 DIAGNOSIS — Z0000 Encounter for general adult medical examination without abnormal findings: Secondary | ICD-10-CM | POA: Diagnosis not present

## 2019-05-09 DIAGNOSIS — E119 Type 2 diabetes mellitus without complications: Secondary | ICD-10-CM | POA: Diagnosis not present

## 2019-05-09 DIAGNOSIS — E039 Hypothyroidism, unspecified: Secondary | ICD-10-CM

## 2019-05-09 DIAGNOSIS — E7800 Pure hypercholesterolemia, unspecified: Secondary | ICD-10-CM | POA: Diagnosis not present

## 2019-05-10 LAB — COMPLETE METABOLIC PANEL WITH GFR
AG Ratio: 1.8 (calc) (ref 1.0–2.5)
ALT: 5 U/L — ABNORMAL LOW (ref 6–29)
AST: 19 U/L (ref 10–35)
Albumin: 4.6 g/dL (ref 3.6–5.1)
Alkaline phosphatase (APISO): 52 U/L (ref 37–153)
BUN: 17 mg/dL (ref 7–25)
CO2: 23 mmol/L (ref 20–32)
Calcium: 9.7 mg/dL (ref 8.6–10.4)
Chloride: 104 mmol/L (ref 98–110)
Creat: 0.8 mg/dL (ref 0.60–0.93)
GFR, Est African American: 86 mL/min/{1.73_m2} (ref >=60)
GFR, Est Non African American: 74 mL/min/{1.73_m2} (ref >=60)
Globulin: 2.5 g/dL (calc) (ref 1.9–3.7)
Glucose, Bld: 106 mg/dL — ABNORMAL HIGH (ref 65–99)
Potassium: 4.3 mmol/L (ref 3.5–5.3)
Sodium: 139 mmol/L (ref 135–146)
Total Bilirubin: 1 mg/dL (ref 0.2–1.2)
Total Protein: 7.1 g/dL (ref 6.1–8.1)

## 2019-05-10 LAB — MICROALBUMIN / CREATININE URINE RATIO
Creatinine, Urine: 16 mg/dL — ABNORMAL LOW (ref 20–275)
Microalb, Ur: <0.2 mg/dL

## 2019-05-10 LAB — CBC WITH DIFFERENTIAL/PLATELET
Absolute Monocytes: 562 cells/uL (ref 200–950)
Basophils Absolute: 58 cells/uL (ref 0–200)
Basophils Relative: 0.8 %
Eosinophils Absolute: 511 cells/uL — ABNORMAL HIGH (ref 15–500)
Eosinophils Relative: 7.1 %
HCT: 38.7 % (ref 35.0–45.0)
Hemoglobin: 12.9 g/dL (ref 11.7–15.5)
Lymphs Abs: 3110 cells/uL (ref 850–3900)
MCH: 31 pg (ref 27.0–33.0)
MCHC: 33.3 g/dL (ref 32.0–36.0)
MCV: 93 fL (ref 80.0–100.0)
MPV: 10.1 fL (ref 7.5–12.5)
Monocytes Relative: 7.8 %
Neutro Abs: 2959 cells/uL (ref 1500–7800)
Neutrophils Relative %: 41.1 %
Platelets: 269 10*3/uL (ref 140–400)
RBC: 4.16 10*6/uL (ref 3.80–5.10)
RDW: 12.7 % (ref 11.0–15.0)
Total Lymphocyte: 43.2 %
WBC: 7.2 10*3/uL (ref 3.8–10.8)

## 2019-05-10 LAB — TSH: TSH: 3.87 mIU/L (ref 0.40–4.50)

## 2019-05-10 LAB — HEMOGLOBIN A1C
Hgb A1c MFr Bld: 7 % of total Hgb — ABNORMAL HIGH (ref <5.7)
Mean Plasma Glucose: 154 (calc)
eAG (mmol/L): 8.5 (calc)

## 2019-05-10 LAB — LIPID PANEL
Cholesterol: 176 mg/dL (ref <200)
HDL: 56 mg/dL (ref >=50)
LDL Cholesterol (Calc): 94 mg/dL (calc)
Non-HDL Cholesterol (Calc): 120 mg/dL (calc) (ref <130)
Total CHOL/HDL Ratio: 3.1 (calc) (ref <5.0)
Triglycerides: 159 mg/dL — ABNORMAL HIGH (ref <150)

## 2019-05-12 ENCOUNTER — Other Ambulatory Visit: Payer: Self-pay

## 2019-05-12 ENCOUNTER — Encounter: Payer: Self-pay | Admitting: Internal Medicine

## 2019-05-12 ENCOUNTER — Ambulatory Visit (INDEPENDENT_AMBULATORY_CARE_PROVIDER_SITE_OTHER): Payer: Medicare HMO | Admitting: Internal Medicine

## 2019-05-12 VITALS — BP 120/80 | HR 88 | Temp 98.3°F | Ht 62.0 in | Wt 149.0 lb

## 2019-05-12 DIAGNOSIS — Z0000 Encounter for general adult medical examination without abnormal findings: Secondary | ICD-10-CM | POA: Diagnosis not present

## 2019-05-12 DIAGNOSIS — E1169 Type 2 diabetes mellitus with other specified complication: Secondary | ICD-10-CM

## 2019-05-12 DIAGNOSIS — E785 Hyperlipidemia, unspecified: Secondary | ICD-10-CM

## 2019-05-12 DIAGNOSIS — E039 Hypothyroidism, unspecified: Secondary | ICD-10-CM | POA: Diagnosis not present

## 2019-05-12 LAB — POCT URINALYSIS DIPSTICK
Appearance: NEGATIVE
Bilirubin, UA: NEGATIVE
Blood, UA: NEGATIVE
Glucose, UA: NEGATIVE
Ketones, UA: NEGATIVE
Leukocytes, UA: NEGATIVE
Nitrite, UA: NEGATIVE
Odor: NEGATIVE
Protein, UA: NEGATIVE
Spec Grav, UA: 1.015 (ref 1.010–1.025)
Urobilinogen, UA: 0.2 E.U./dL
pH, UA: 6 (ref 5.0–8.0)

## 2019-05-12 NOTE — Progress Notes (Signed)
Subjective:    Patient ID: Paige Ali, female    DOB: 16-Oct-1946, 73 y.o.   MRN: [id number]  HPI 73 year old Female in today for Medicare wellness, routine health maintenance and evaluation of medical issues including hypothyroidism, glucose intolerance and hyperlipidemia.  General health is good.  She is a native of [place].  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  History of right shoulder arthropathy and is seeing orthopedist in the past.  Continues to exercise regularly.  Old records indicates she became glucose intolerance around 2006.  She takes metformin.  Social history: She has been in the [place] now for some 18 years.  She has a college degree.  She is a widow.  She does not smoke.  Daughter lives in [place].  Son lives here in [place] and has a coffee business called [employer].  Social alcohol consumption.  Enjoys swimming and riding her bicycle.  Family history: Father passed away at nearly 61 years of age due to complications of a leg infection.  He lives in [place].  Mother with history of diabetes still living.  2 brothers and 2 sisters that are healthy.     Review of Systems no new complaints     Objective:   Physical Exam Weight 149 pounds.  BMI 27.25 blood pressure 120/80, pulse 88, temperature 98.3 degrees orally pulse oximetry 99%.  Height 5 feet 10 inches.  Skin warm and dry.  Nodes none.  TMs and pharynx are clear.  Neck is supple without JVD thyromegaly or carotid bruits.  Chest is clear to auscultation without rales or wheezing.  Breasts normal female without masses.  Cardiac exam regular rate and rhythm normal S1 and S2 without murmurs gallops or rubs.  Abdomen bowel sounds are active abdomen is soft nondistended without hepatosplenomegaly masses or tenderness.  Extremities without edema.  No deformity.  No adenopathy.  Pap deferred due to age.  Bimanual is normal.  Psychiatric: She has normal mood and affect.  Her behavior judgment and  thought content normal.       Assessment & Plan:  Normal health maintenance exam  Impaired glucose tolerance-hemoglobin A1c has increased from 6.8% in January to 7% likely due to pandemic and lack of exercise.  Cannot swim what she used to do as the probe was closed.  Hyperlipidemia-triglycerides elevated at 159 and previously were normal in January.  Liver functions are normal  Hypothyroidism-stable on thyroid replacement therapy  Plan: Continue to work on diet and exercise.  Continue same medications and follow-up in 6 months.  Recommend annual flu vaccine this fall.  Recommend annual mammogram.  Subjective:   Patient presents for Medicare Annual/Subsequent preventive examination.  Review Past Medical/Family/Social: Above   Risk Factors  Current exercise habits: Walks and rides bicycle-used to swim until the pandemic Dietary issues discussed: Low-fat low carbohydrate  Cardiac risk factors: Impaired glucose tolerance  Depression Screen  (Note: if answer to either of the following is "Yes", a more complete depression screening is indicated)   Over the past two weeks, have you felt down, depressed or hopeless? No  Over the past two weeks, have you felt little interest or pleasure in doing things? No Have you lost interest or pleasure in daily life? No Do you often feel hopeless? No Do you cry easily over simple problems? No   Activities of Daily Living  In your present state of health, do you have any difficulty performing the following activities?:   Driving? No  Managing  money? No  Feeding yourself? No  Getting from bed to chair? No  Climbing a flight of stairs? No  Preparing food and eating?: No  Bathing or showering? No  Getting dressed: No  Getting to the toilet? No  Using the toilet:No  Moving around from place to place: No  In the past year have you fallen or had a near fall?:No  Are you sexually active? No  Do you have more than one partner? No   Hearing  Difficulties: No  Do you often ask people to speak up or repeat themselves? No  Do you experience ringing or noises in your ears? No  Do you have difficulty understanding soft or whispered voices?  Yes sometimes Do you feel that you have a problem with memory? No Do you often misplace items? No    Home Safety:  Do you have a smoke alarm at your residence? Yes Do you have grab bars in the bathroom?  Yes Do you have throw rugs in your house?  Yes   Cognitive Testing  Alert? Yes Normal Appearance?Yes  Oriented to person? Yes Place? Yes  Time? Yes  Recall of three objects? Yes  Can perform simple calculations? Yes  Displays appropriate judgment?Yes  Can read the correct time from a watch face?Yes   List the Names of Other Physician/Practitioners you currently use:  See referral list for the physicians patient is currently seeing.     Review of Systems: See above   Objective:     General appearance: Appears stated age and mildly obese  Head: Normocephalic, without obvious abnormality, atraumatic  Eyes: conj clear, EOMi PEERLA  Ears: normal TM's and external ear canals both ears  Nose: Nares normal. Septum midline. Mucosa normal. No drainage or sinus tenderness.  Throat: lips, mucosa, and tongue normal; teeth and gums normal  Neck: no adenopathy, no carotid bruit, no JVD, supple, symmetrical, trachea midline and thyroid not enlarged, symmetric, no tenderness/mass/nodules  No CVA tenderness.  Lungs: clear to auscultation bilaterally  Breasts: normal appearance, no masses or tenderness Heart: regular rate and rhythm, S1, S2 normal, no murmur, click, rub or gallop  Abdomen: soft, non-tender; bowel sounds normal; no masses, no organomegaly  Musculoskeletal: ROM normal in all joints, no crepitus, no deformity, Normal muscle strengthen. Back  is symmetric, no curvature. Skin: Skin color, texture, turgor normal. No rashes or lesions  Lymph nodes: Cervical, supraclavicular, and  axillary nodes normal.  Neurologic: CN 2 -12 Normal, Normal symmetric reflexes. Normal coordination and gait  Psych: Alert & Oriented x 3, Mood appear stable.    Assessment:    Annual wellness medicare exam   Plan:    During the course of the visit the patient was educated and counseled about appropriate screening and preventive services including:   Recommend annual flu vaccine and annual mammogram     Patient Instructions (the written plan) was given to the patient.  Medicare Attestation  I have personally reviewed:  The patient's medical and social history  Their use of alcohol, tobacco or illicit drugs  Their current medications and supplements  The patient's functional ability including ADLs,fall risks, home safety risks, cognitive, and hearing and visual impairment  Diet and physical activities  Evidence for depression or mood disorders  The patient's weight, height, BMI, and visual acuity have been recorded in the chart. I have made referrals, counseling, and provided education to the patient based on review of the above and I have provided the patient with a written  personalized care plan for preventive services.

## 2019-05-25 NOTE — Patient Instructions (Signed)
It was a pleasure to see you today.  Work on diet and exercise and follow-up in 6 months.  No change in medication.

## 2019-05-30 DIAGNOSIS — Z1231 Encounter for screening mammogram for malignant neoplasm of breast: Secondary | ICD-10-CM | POA: Diagnosis not present

## 2019-05-30 LAB — HM MAMMOGRAPHY

## 2019-06-03 ENCOUNTER — Encounter: Payer: Self-pay | Admitting: Internal Medicine

## 2019-06-06 ENCOUNTER — Telehealth (INDEPENDENT_AMBULATORY_CARE_PROVIDER_SITE_OTHER): Payer: Medicare HMO | Admitting: Internal Medicine

## 2019-06-06 ENCOUNTER — Encounter: Payer: Self-pay | Admitting: Internal Medicine

## 2019-06-06 DIAGNOSIS — H6691 Otitis media, unspecified, right ear: Secondary | ICD-10-CM | POA: Diagnosis not present

## 2019-06-06 NOTE — Telephone Encounter (Addendum)
Spoke with Paige Ali and let her know that Dr Renold Genta will call something in for her Right ear. [facility]   Patient has called complaining of right ear pain and likely has serous otitis media. I am prescribing Zithromax Zpak 2 tabs po day 1 followed by one po days 2-5

## 2019-06-06 NOTE — Telephone Encounter (Signed)
Paige Ali [phone]  Lus called to say she is having right ear pain that started today, it comes and goes, she wants to know if there is there something you can suggest or does she need to come in and be seen.

## 2019-06-24 DIAGNOSIS — H25813 Combined forms of age-related cataract, bilateral: Secondary | ICD-10-CM | POA: Diagnosis not present

## 2019-06-24 DIAGNOSIS — H04123 Dry eye syndrome of bilateral lacrimal glands: Secondary | ICD-10-CM | POA: Diagnosis not present

## 2019-06-24 DIAGNOSIS — E119 Type 2 diabetes mellitus without complications: Secondary | ICD-10-CM | POA: Diagnosis not present

## 2019-06-24 DIAGNOSIS — H40013 Open angle with borderline findings, low risk, bilateral: Secondary | ICD-10-CM | POA: Diagnosis not present

## 2019-07-04 ENCOUNTER — Other Ambulatory Visit: Payer: Self-pay | Admitting: Internal Medicine

## 2019-07-11 ENCOUNTER — Other Ambulatory Visit: Payer: Self-pay

## 2019-07-11 MED ORDER — METFORMIN HCL 500 MG PO TABS: ORAL_TABLET | ORAL | 0 refills | Status: DC

## 2019-07-28 ENCOUNTER — Encounter: Payer: Self-pay | Admitting: Internal Medicine

## 2019-07-28 NOTE — Addendum Note (Signed)
Addended by: Elby Showers on: 07/28/2019 06:24 PM   Modules accepted: Level of Service

## 2019-11-14 ENCOUNTER — Other Ambulatory Visit: Payer: Medicare HMO | Admitting: Internal Medicine

## 2019-11-14 ENCOUNTER — Other Ambulatory Visit: Payer: Self-pay

## 2019-11-14 DIAGNOSIS — E119 Type 2 diabetes mellitus without complications: Secondary | ICD-10-CM | POA: Diagnosis not present

## 2019-11-14 DIAGNOSIS — E7800 Pure hypercholesterolemia, unspecified: Secondary | ICD-10-CM

## 2019-11-14 DIAGNOSIS — E039 Hypothyroidism, unspecified: Secondary | ICD-10-CM | POA: Diagnosis not present

## 2019-11-14 NOTE — Addendum Note (Signed)
Addended by: Mady Haagensen on: 11/14/2019 12:19 PM   Modules accepted: Orders

## 2019-11-15 LAB — HEPATIC FUNCTION PANEL
AG Ratio: 1.8 (calc) (ref 1.0–2.5)
ALT: 8 U/L (ref 6–29)
AST: 21 U/L (ref 10–35)
Albumin: 4.9 g/dL (ref 3.6–5.1)
Alkaline phosphatase (APISO): 57 U/L (ref 37–153)
Bilirubin, Direct: 0.1 mg/dL (ref 0.0–0.2)
Globulin: 2.8 g/dL (calc) (ref 1.9–3.7)
Indirect Bilirubin: 0.8 mg/dL (calc) (ref 0.2–1.2)
Total Bilirubin: 0.9 mg/dL (ref 0.2–1.2)
Total Protein: 7.7 g/dL (ref 6.1–8.1)

## 2019-11-15 LAB — MICROALBUMIN / CREATININE URINE RATIO
Creatinine, Urine: 19 mg/dL — ABNORMAL LOW (ref 20–275)
Microalb Creat Ratio: 11 mcg/mg creat (ref <30)
Microalb, Ur: 0.2 mg/dL

## 2019-11-15 LAB — LIPID PANEL
Cholesterol: 206 mg/dL — ABNORMAL HIGH (ref <200)
HDL: 54 mg/dL (ref >=50)
LDL Cholesterol (Calc): 121 mg/dL (calc) — ABNORMAL HIGH
Non-HDL Cholesterol (Calc): 152 mg/dL (calc) — ABNORMAL HIGH (ref <130)
Total CHOL/HDL Ratio: 3.8 (calc) (ref <5.0)
Triglycerides: 192 mg/dL — ABNORMAL HIGH (ref <150)

## 2019-11-15 LAB — HEMOGLOBIN A1C
Hgb A1c MFr Bld: 7.2 % of total Hgb — ABNORMAL HIGH (ref <5.7)
Mean Plasma Glucose: 160 (calc)
eAG (mmol/L): 8.9 (calc)

## 2019-11-15 LAB — TSH: TSH: 5.25 mIU/L — ABNORMAL HIGH (ref 0.40–4.50)

## 2019-11-17 ENCOUNTER — Other Ambulatory Visit: Payer: Self-pay

## 2019-11-17 ENCOUNTER — Encounter: Payer: Self-pay | Admitting: Internal Medicine

## 2019-11-17 ENCOUNTER — Ambulatory Visit (INDEPENDENT_AMBULATORY_CARE_PROVIDER_SITE_OTHER): Payer: Medicare HMO | Admitting: Internal Medicine

## 2019-11-17 VITALS — BP 120/80 | HR 80 | Temp 98.0°F | Ht 62.0 in | Wt 151.0 lb

## 2019-11-17 DIAGNOSIS — E039 Hypothyroidism, unspecified: Secondary | ICD-10-CM | POA: Diagnosis not present

## 2019-11-17 DIAGNOSIS — E119 Type 2 diabetes mellitus without complications: Secondary | ICD-10-CM | POA: Diagnosis not present

## 2019-11-17 DIAGNOSIS — E785 Hyperlipidemia, unspecified: Secondary | ICD-10-CM

## 2019-11-17 DIAGNOSIS — H6504 Acute serous otitis media, recurrent, right ear: Secondary | ICD-10-CM

## 2019-11-17 DIAGNOSIS — E1169 Type 2 diabetes mellitus with other specified complication: Secondary | ICD-10-CM | POA: Diagnosis not present

## 2019-11-17 DIAGNOSIS — R7989 Other specified abnormal findings of blood chemistry: Secondary | ICD-10-CM | POA: Diagnosis not present

## 2019-11-17 MED ORDER — AZITHROMYCIN 250 MG PO TABS: ORAL_TABLET | ORAL | 0 refills | Status: DC

## 2019-11-17 MED ORDER — LEVOTHYROXINE SODIUM 75 MCG PO TABS: 75.0000 ug | ORAL_TABLET | Freq: Every day | ORAL | 0 refills | Status: DC

## 2019-11-17 MED ORDER — METHYLPREDNISOLONE ACETATE 80 MG/ML IJ SUSP
80.0000 mg | Freq: Once | INTRAMUSCULAR | Status: AC
  Administered 2019-11-17: 14:00:00 80 mg via INTRAMUSCULAR

## 2019-11-17 MED ORDER — GLIPIZIDE 5 MG PO TABS: 2.5000 mg | ORAL_TABLET | Freq: Every day | ORAL | 0 refills | Status: DC

## 2019-11-17 NOTE — Progress Notes (Signed)
   Subjective:    Patient ID: Paige Ali, female    DOB: 1946-01-03, 74 y.o.   MRN: [id number]  HPI 74 year old Female here today for 52-month recheck.  She is anxious to get Covid 19 vaccine.  History of hypothyroidism, hyperlipidemia, diabetes mellitus.  Hemoglobin A1c 7.2% and previously was 7% in July and 6.8% in January 2020.  Not getting out as much and doing as much exercise.  With regard to hyperlipidemia, total cholesterol is 206, triglycerides 192 increased from 159 in July 2020 and LDL cholesterol is 121 and previously was 94 in July 2020  With regard to hypothyroidism, TSH is elevated at 5.25.  Previously on levothyroxine 50 mcg daily and will increase to 75 mcg daily.  Needs follow-up in March.  Has been on Lipitor 20 mg daily.  Needs to watch diet and get a bit more exercise.  For diabetes mellitus she is being started on glipizide 5 mg daily with follow-up in March.  She is already on Glucophage 500 mg twice daily.  Also complaining of some respiratory infection type symptoms with nasal congestion and right ear discomfort.  No fever.  No known COVID-19 exposure.      Review of Systems see above     Objective:   Physical Exam  Blood pressure 120/80, pulse 88 temperature 98 degrees orally pulse oximetry 98% weight 151 pounds, BMI 27.62  Skin warm and dry.  Nodes none.  Neck is supple without JVD thyromegaly or carotid bruits.  Chest clear to auscultation.  Right TM is full with splayed light reflex but not red.  Cardiac exam regular rate and rhythm.  Extremities without edema.      Assessment & Plan:  Right serous otitis media-treat with Zithromax Z-PAK and call if not better in 10 days to 2 weeks  Hypothyroidism-increase levothyroxine to 75 mcg daily and follow-up in March  Type 2 diabetes mellitus- add glipizide to Glucophage and follow-up in March  Mixed hyperlipidemia-continue atorvastatin 20 mg daily.  Watch diet and try to get more exercise.

## 2019-11-24 ENCOUNTER — Telehealth: Payer: Self-pay | Admitting: Internal Medicine

## 2019-11-24 NOTE — Telephone Encounter (Signed)
The antibiotic stays in system 10 days even though she just took it for 6. Give it some more time.

## 2019-11-24 NOTE — Telephone Encounter (Signed)
Called and spoke with patient to let her know Dr Renold Genta said give a little more time for antibiotic to work. She verbalized understanding.

## 2019-11-24 NOTE — Telephone Encounter (Signed)
Paige Ali called to say that her right ear is still hurting a little, she finished antibiotic on Friday. Should she give it a little more time, or how will she know it is better and the fluid is gone?

## 2019-12-01 ENCOUNTER — Ambulatory Visit: Payer: Medicare HMO

## 2019-12-07 ENCOUNTER — Ambulatory Visit: Payer: Medicare HMO | Attending: Internal Medicine

## 2019-12-07 DIAGNOSIS — Z23 Encounter for immunization: Secondary | ICD-10-CM

## 2019-12-07 NOTE — Progress Notes (Signed)
   Covid-19 Vaccination Clinic  Name:  Paige Ali    MRN: [id number] DOB: [birth year]  12/07/2019  Ms. Paige Ali was observed post Covid-19 immunization for 15 minutes without incidence. She was provided with Vaccine Information Sheet and instruction to access the V-Safe system.   Ms. Paige Ali was instructed to call 911 with any severe reactions post vaccine: Marland Kitchen Difficulty breathing  . Swelling of your face and throat  . A fast heartbeat  . A bad rash all over your body  . Dizziness and weakness    Immunizations Administered    Name Date Dose VIS Date Route   Pfizer COVID-19 Vaccine 12/07/2019 11:28 AM 0.3 mL 10/10/2019 Intramuscular   Manufacturer: [place]   Lot: [id number]   [place]: [id number]

## 2019-12-08 ENCOUNTER — Ambulatory Visit: Payer: Medicare HMO

## 2019-12-23 NOTE — Patient Instructions (Signed)
It was a pleasure to see you today.  Increase levothyroxine to 75 mcg daily and follow-up in March.  For serous otitis media take Zithromax Z-PAK as directed.  Glipizide added to Glucophage for diabetic control with follow-up in March.  Continue atorvastatin for hyperlipidemia.

## 2019-12-31 ENCOUNTER — Ambulatory Visit: Payer: Medicare HMO | Attending: Internal Medicine

## 2019-12-31 DIAGNOSIS — Z23 Encounter for immunization: Secondary | ICD-10-CM

## 2019-12-31 NOTE — Progress Notes (Signed)
   Covid-19 Vaccination Clinic  Name:  Paige Ali    MRN: [id number] DOB: 1946-03-01  12/31/2019  [name] was observed post Covid-19 immunization for 15 minutes without incident. She was provided with Vaccine Information Sheet and instruction to access the V-Safe system.   [name] was instructed to call 911 with any severe reactions post vaccine: Marland Kitchen Difficulty breathing  . Swelling of face and throat  . A fast heartbeat  . A bad rash all over body  . Dizziness and weakness   Immunizations Administered    Name Date Dose VIS Date Route   Pfizer COVID-19 Vaccine 12/31/2019 11:33 AM 0.3 mL 10/10/2019 Intramuscular   Manufacturer: [place]   Lot: [id number]   [place]: [id number]

## 2020-01-07 ENCOUNTER — Other Ambulatory Visit: Payer: Self-pay | Admitting: Internal Medicine

## 2020-01-09 ENCOUNTER — Other Ambulatory Visit: Payer: Medicare HMO | Admitting: Internal Medicine

## 2020-01-09 ENCOUNTER — Other Ambulatory Visit: Payer: Self-pay

## 2020-01-09 DIAGNOSIS — E119 Type 2 diabetes mellitus without complications: Secondary | ICD-10-CM

## 2020-01-09 DIAGNOSIS — E039 Hypothyroidism, unspecified: Secondary | ICD-10-CM

## 2020-01-10 LAB — HEMOGLOBIN A1C
Hgb A1c MFr Bld: 7.2 % of total Hgb — ABNORMAL HIGH (ref <5.7)
Mean Plasma Glucose: 160 (calc)
eAG (mmol/L): 8.9 (calc)

## 2020-01-10 LAB — TSH: TSH: 2.03 mIU/L (ref 0.40–4.50)

## 2020-01-12 ENCOUNTER — Other Ambulatory Visit: Payer: Self-pay

## 2020-01-12 ENCOUNTER — Encounter: Payer: Self-pay | Admitting: Internal Medicine

## 2020-01-12 ENCOUNTER — Ambulatory Visit (INDEPENDENT_AMBULATORY_CARE_PROVIDER_SITE_OTHER): Payer: Medicare HMO | Admitting: Internal Medicine

## 2020-01-12 VITALS — BP 130/80 | HR 88 | Temp 98.0°F | Ht 62.0 in | Wt 151.0 lb

## 2020-01-12 DIAGNOSIS — R7989 Other specified abnormal findings of blood chemistry: Secondary | ICD-10-CM

## 2020-01-12 DIAGNOSIS — E119 Type 2 diabetes mellitus without complications: Secondary | ICD-10-CM

## 2020-01-12 MED ORDER — GLIPIZIDE ER 2.5 MG PO TB24: 2.5000 mg | ORAL_TABLET | Freq: Every day | ORAL | 0 refills | Status: DC

## 2020-01-12 NOTE — Patient Instructions (Signed)
It was a pleasure to see you today.  Continue with same dose of thyroid replacement his TSH is now normal.  I have called in a new prescription to mail order pharmacy for Glucotrol XL 2.5 mg daily.  Please discard 5 mg tablets.  Follow-up here in 8 weeks with office visit and hemoglobin A1c.

## 2020-01-12 NOTE — Progress Notes (Signed)
   Subjective:    Patient ID: Paige Ali, female    DOB: 09-Nov-1945, 74 y.o.   MRN: [id number]  HPI 8-week follow-up on type 2 diabetes mellitus.  She has been working on diet and exercise.    Says she is watching her diet.  Weather has not been suitable for a lot of exercise.  In January, she was prescribed glipizide 5 mg to take 1/2 tablet daily before breakfast.  I do not think she has been taking this and there is been no significant change in her hemoglobin A1c.  It is still 7.2%.  There may have been some miscommunication due to language issues.  Also it may have been difficult for her to cut the 5 mg tablet in half.  Also her TSH was elevated in January at 5.25.  She denied missing doses or taking it with food.  We increased levothyroxine to 0.075 mg daily and TSH is now normal   Review of Systems no new complaints Had first Covid vaccine February 7 and second Covid vaccine March 3    Objective:   Physical Exam Blood pressure 130/80, pulse 88, temperature 98 degrees orally BMI 27.62 weight 151 pounds  Neck supple without JVD thyromegaly or carotid bruits.  Chest clear to auscultation.  Cardiac exam regular rate and rhythm.  No lower extremity edema.       Assessment & Plan:   Controlled type 2 diabetes mellitus.  Hemoglobin A1c still the same at 7.2%.  Have prescribed Glucotrol XL 2.5 mg daily.  It is difficult for her to cut the 5 mg tablet in half.  Return in 8 weeks for hemoglobin A1c  Hypothyroidism-TSH is now normal on increased dose of thyroid replacement medication and we will continue with same dose.  In 8 weeks she will have office visit and hemoglobin A1c.  TSH is now normal on 0.075 mg of levothyroxine daily.

## 2020-02-18 ENCOUNTER — Other Ambulatory Visit: Payer: Self-pay | Admitting: Internal Medicine

## 2020-03-08 ENCOUNTER — Other Ambulatory Visit: Payer: Medicare HMO | Admitting: Internal Medicine

## 2020-03-08 ENCOUNTER — Other Ambulatory Visit: Payer: Self-pay

## 2020-03-08 DIAGNOSIS — E119 Type 2 diabetes mellitus without complications: Secondary | ICD-10-CM | POA: Diagnosis not present

## 2020-03-09 DIAGNOSIS — Z20828 Contact with and (suspected) exposure to other viral communicable diseases: Secondary | ICD-10-CM | POA: Diagnosis not present

## 2020-03-09 LAB — HEMOGLOBIN A1C
Hgb A1c MFr Bld: 6.8 % of total Hgb — ABNORMAL HIGH (ref <5.7)
Mean Plasma Glucose: 148 (calc)
eAG (mmol/L): 8.2 (calc)

## 2020-03-11 ENCOUNTER — Encounter: Payer: Self-pay | Admitting: Internal Medicine

## 2020-03-11 ENCOUNTER — Other Ambulatory Visit: Payer: Self-pay

## 2020-03-11 ENCOUNTER — Ambulatory Visit: Payer: Medicare HMO | Admitting: Internal Medicine

## 2020-03-11 ENCOUNTER — Ambulatory Visit (INDEPENDENT_AMBULATORY_CARE_PROVIDER_SITE_OTHER): Payer: Medicare HMO | Admitting: Internal Medicine

## 2020-03-11 VITALS — BP 130/80 | HR 90 | Temp 97.8°F | Ht 62.0 in | Wt 151.0 lb

## 2020-03-11 DIAGNOSIS — E119 Type 2 diabetes mellitus without complications: Secondary | ICD-10-CM | POA: Diagnosis not present

## 2020-03-11 DIAGNOSIS — E039 Hypothyroidism, unspecified: Secondary | ICD-10-CM | POA: Diagnosis not present

## 2020-03-11 DIAGNOSIS — E785 Hyperlipidemia, unspecified: Secondary | ICD-10-CM | POA: Diagnosis not present

## 2020-03-11 DIAGNOSIS — E1169 Type 2 diabetes mellitus with other specified complication: Secondary | ICD-10-CM

## 2020-03-28 NOTE — Patient Instructions (Signed)
It was a pleasure to see you today. Continue current medications. Health maintenance exam and Medicare wellness visit due after July 20.

## 2020-03-28 NOTE — Progress Notes (Signed)
   Subjective:    Patient ID: Paige Ali, female    DOB: 10-30-1946, 74 y.o.   MRN: [id number]  HPI 74 year old Female seen for follow up on Type 2 diabetes mellitus, hypothyroidism and hyperlipidemia. Feels well with no new complaints. Hemoglobin A1c was 7.2% in January and March and is now down to 6.8%. She is on Metformin 500 mg twice daily and recently added glipizide 2.5 mg extended release daily with breakfast. This was done in March. Feels well with no complaints on his new medication. TSH in March was normal and was not repeated with this visit.  Had 2 COVID-19 vaccines in February and March.      Review of Systems see above-no new complaints     Objective:   Physical Exam  Blood pressure 130/80 pulse 90 temperature 97.8 degrees orally temperature 98% weight 151 pounds BMI 27.62. Neck is supple without JVD thyromegaly or carotid bruits. Chest clear to auscultation. Cardiac exam regular rate and rhythm. Extremities without edema. Affect thought and judgment are normal and she is very pleasant as always. Diabetic foot exam is within normal limits.     Assessment & Plan:  Improved type 2 diabetes mellitus-glipizide recently added and help diabetic control in addition to Metformin. Hemoglobin A1c recently checked 6.8% and was 7.2% in March.  Hyperlipidemia treated with atorvastatin-patient continue to watch diet and will be more active this summer. Triglycerides were elevated in January at 192 and were not repeated with this visit. LDL was 121. Have not changed dose of atorvastatin.  Hypothyroidism-TSH checked in March was normal.  Plan: Annual Medicare wellness visit and health maintenance exam due after July 13.

## 2020-04-16 ENCOUNTER — Other Ambulatory Visit: Payer: Self-pay | Admitting: Internal Medicine

## 2020-04-21 DIAGNOSIS — H524 Presbyopia: Secondary | ICD-10-CM | POA: Diagnosis not present

## 2020-05-02 ENCOUNTER — Other Ambulatory Visit: Payer: Self-pay | Admitting: Internal Medicine

## 2020-05-14 ENCOUNTER — Other Ambulatory Visit: Payer: Self-pay

## 2020-05-14 ENCOUNTER — Other Ambulatory Visit: Payer: Medicare HMO | Admitting: Internal Medicine

## 2020-05-14 DIAGNOSIS — Z0000 Encounter for general adult medical examination without abnormal findings: Secondary | ICD-10-CM

## 2020-05-14 DIAGNOSIS — E7800 Pure hypercholesterolemia, unspecified: Secondary | ICD-10-CM

## 2020-05-14 DIAGNOSIS — E039 Hypothyroidism, unspecified: Secondary | ICD-10-CM

## 2020-05-14 DIAGNOSIS — E119 Type 2 diabetes mellitus without complications: Secondary | ICD-10-CM

## 2020-05-15 LAB — CBC WITH DIFFERENTIAL/PLATELET
Absolute Monocytes: 540 cells/uL (ref 200–950)
Basophils Absolute: 50 cells/uL (ref 0–200)
Basophils Relative: 0.7 %
Eosinophils Absolute: 338 cells/uL (ref 15–500)
Eosinophils Relative: 4.7 %
HCT: 37.5 % (ref 35.0–45.0)
Hemoglobin: 12.4 g/dL (ref 11.7–15.5)
Lymphs Abs: 2966 cells/uL (ref 850–3900)
MCH: 30.9 pg (ref 27.0–33.0)
MCHC: 33.1 g/dL (ref 32.0–36.0)
MCV: 93.5 fL (ref 80.0–100.0)
MPV: 10 fL (ref 7.5–12.5)
Monocytes Relative: 7.5 %
Neutro Abs: 3305 cells/uL (ref 1500–7800)
Neutrophils Relative %: 45.9 %
Platelets: 263 10*3/uL (ref 140–400)
RBC: 4.01 10*6/uL (ref 3.80–5.10)
RDW: 12 % (ref 11.0–15.0)
Total Lymphocyte: 41.2 %
WBC: 7.2 10*3/uL (ref 3.8–10.8)

## 2020-05-15 LAB — LIPID PANEL
Cholesterol: 191 mg/dL (ref <200)
HDL: 58 mg/dL (ref >=50)
LDL Cholesterol (Calc): 108 mg/dL (calc) — ABNORMAL HIGH
Non-HDL Cholesterol (Calc): 133 mg/dL (calc) — ABNORMAL HIGH (ref <130)
Total CHOL/HDL Ratio: 3.3 (calc) (ref <5.0)
Triglycerides: 141 mg/dL (ref <150)

## 2020-05-15 LAB — COMPLETE METABOLIC PANEL WITH GFR
AG Ratio: 2 (calc) (ref 1.0–2.5)
ALT: 8 U/L (ref 6–29)
AST: 20 U/L (ref 10–35)
Albumin: 4.9 g/dL (ref 3.6–5.1)
Alkaline phosphatase (APISO): 57 U/L (ref 37–153)
BUN: 15 mg/dL (ref 7–25)
CO2: 25 mmol/L (ref 20–32)
Calcium: 9.6 mg/dL (ref 8.6–10.4)
Chloride: 103 mmol/L (ref 98–110)
Creat: 0.74 mg/dL (ref 0.60–0.93)
GFR, Est African American: 94 mL/min/{1.73_m2} (ref >=60)
GFR, Est Non African American: 81 mL/min/{1.73_m2} (ref >=60)
Globulin: 2.4 g/dL (calc) (ref 1.9–3.7)
Glucose, Bld: 122 mg/dL — ABNORMAL HIGH (ref 65–99)
Potassium: 4.3 mmol/L (ref 3.5–5.3)
Sodium: 139 mmol/L (ref 135–146)
Total Bilirubin: 0.9 mg/dL (ref 0.2–1.2)
Total Protein: 7.3 g/dL (ref 6.1–8.1)

## 2020-05-15 LAB — HEMOGLOBIN A1C
Hgb A1c MFr Bld: 6.8 % of total Hgb — ABNORMAL HIGH (ref <5.7)
Mean Plasma Glucose: 148 (calc)
eAG (mmol/L): 8.2 (calc)

## 2020-05-15 LAB — TSH: TSH: 1.29 mIU/L (ref 0.40–4.50)

## 2020-05-17 ENCOUNTER — Encounter: Payer: Self-pay | Admitting: Internal Medicine

## 2020-05-17 ENCOUNTER — Ambulatory Visit (INDEPENDENT_AMBULATORY_CARE_PROVIDER_SITE_OTHER): Payer: Medicare HMO | Admitting: Internal Medicine

## 2020-05-17 ENCOUNTER — Other Ambulatory Visit: Payer: Self-pay

## 2020-05-17 VITALS — BP 110/80 | HR 81 | Ht 62.0 in | Wt 152.0 lb

## 2020-05-17 DIAGNOSIS — Z0000 Encounter for general adult medical examination without abnormal findings: Secondary | ICD-10-CM | POA: Diagnosis not present

## 2020-05-17 DIAGNOSIS — E039 Hypothyroidism, unspecified: Secondary | ICD-10-CM

## 2020-05-17 DIAGNOSIS — E1169 Type 2 diabetes mellitus with other specified complication: Secondary | ICD-10-CM

## 2020-05-17 DIAGNOSIS — E785 Hyperlipidemia, unspecified: Secondary | ICD-10-CM

## 2020-05-17 DIAGNOSIS — M26621 Arthralgia of right temporomandibular joint: Secondary | ICD-10-CM | POA: Diagnosis not present

## 2020-05-17 MED ORDER — MELOXICAM 15 MG PO TABS: 15.0000 mg | ORAL_TABLET | Freq: Every day | ORAL | 0 refills | Status: DC

## 2020-05-17 NOTE — Patient Instructions (Signed)
Take meloxicam for right TM joint tenderness.  Continue current medications.  Follow-up here in 6 months.  Have mammogram.  Have annual flu vaccine in the fall.

## 2020-05-17 NOTE — Progress Notes (Signed)
Subjective:    Patient ID: Paige Ali, female    DOB: [birth year], 74 y.o.   MRN: [id number]  HPI  74 year old Female for health maintenance exam and evaluation of medical issues.  She has a history of hypothyroidism, glucose intolerance and hyperlipidemia.  Her general health is good.  Social history: She is a native of [place].  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  Daughter lives in [place] and she is going to visit her later this week.  Son lives here.  History of right shoulder arthropathy and is seen orthopedist in the past.  She tries to exercise regularly with walking and riding her bicycle.  Old records indicate she became glucose intolerant around 2006.  Immunizations are up-to-date including 2 COVID-19 vaccines.  Had colonoscopy in 2020.  Mammogram is due in the near future and was last done July 2020.  Social history: She has been in the [place] now for some 19 years.  She has a college degree.  She is a widow.  Does not smoke.  Social alcohol consumption.  Also enjoys swimming in addition to other modes of exercise.  Family history: Her father passed away at nearly 20 years of age due to complications of a leg infection.  He lives in [place].  Mother still living with history of diabetes.  2 brothers and 2 sisters that are healthy.  Labs reviewed-LDL is 108, TSH is normal on current dose of thyroid replacement and hemoglobin A1c is stable at 6.8%.  CBC and c-Met are normal except for fasting glucose of 122.  Patient has been eating figs and cherries recently    Review of Systems hair loss- has used various treatments without results.  TSH is within normal limits on thyroid replacement medication.  Complaining of pain in right ear and TM joint.     Objective:   Physical Exam Blood pressure 110/80 pulse 81 pulse oximetry 98% weight 152 pounds BMI 27.80  Skin warm and dry.  Nodes none.  TMs are clear.  She is tender over her right TM joint.  Pharynx is  clear.  Neck is supple without JVD thyromegaly or carotid bruits.  Chest is clear to auscultation without rales or wheezing.  Breasts normal female without masses.  Cardiac exam regular rate and rhythm normal S1 and S2 without murmurs or gallops.  Abdomen soft nondistended without hepatosplenomegaly masses or tenderness.  Extremities without pitting edema.  Neuro intact without focal deficits.  Affect thought and judgment appear to be normal.       Assessment & Plan:  Impaired glucose tolerance-stable with Metformin and glipizide.  Watch carbohydrates such as feedings and cherries.  Continue current medications follow-up in 6 months.    Hyperlipidemia treated with Lipitor 20 mg daily and stable  Right TMJ-have prescribed meloxicam 15 mg daily.  Do not see issue with right TM causing ear pain- suspect this is referred pain from TM joint  Hypothyroidism stable on levothyroxine 0.075 mg daily  Plan: Continue to work on diet and exercise.  No change in medications.  Follow-up in 6 months.  Subjective:   Patient presents for Medicare Annual/Subsequent preventive examination.  Review Past Medical/Family/Social: See above   Risk Factors  Current exercise habits: Exercises regularly.  Likes swimming and riding her bicycle Dietary issues discussed: Low-fat low carbohydrate  Cardiac risk factors: Impaired glucose tolerance and hyperlipidemia  Depression Screen  (Note: if answer to either of the following is "Yes", a more complete  depression screening is indicated)   Over the past two weeks, have you felt down, depressed or hopeless? No  Over the past two weeks, have you felt little interest or pleasure in doing things? No Have you lost interest or pleasure in daily life? No Do you often feel hopeless? No Do you cry easily over simple problems? No   Activities of Daily Living  In your present state of health, do you have any difficulty performing the following activities?:   Driving? No   Managing money? No  Feeding yourself? No  Getting from bed to chair? No  Climbing a flight of stairs? No  Preparing food and eating?: No  Bathing or showering? No  Getting dressed: No  Getting to the toilet? No  Using the toilet:No  Moving around from place to place: No  In the past year have you fallen or had a near fall?:No  Are you sexually active? No  Do you have more than one partner? No   Hearing Difficulties: No  Do you often ask people to speak up or repeat themselves? No  Do you experience ringing or noises in your ears? No  Do you have difficulty understanding soft or whispered voices? No  Do you feel that you have a problem with memory? No Do you often misplace items? No    Home Safety:  Do you have a smoke alarm at your residence? Yes Do you have grab bars in the bathroom?  Yes Do you have throw rugs in your house?  None   Cognitive Testing  Alert? Yes Normal Appearance?Yes  Oriented to person? Yes Place? Yes  Time? Yes  Recall of three objects? Yes  Can perform simple calculations? Yes  Displays appropriate judgment?Yes  Can read the correct time from a watch face?Yes   List the Names of Other Physician/Practitioners you currently use:  See referral list for the physicians patient is currently seeing.   None Review of Systems: See above   Objective:     General appearance: Appears younger than stated age Head: Normocephalic, without obvious abnormality, atraumatic  Eyes: conj clear, EOMi PEERLA  Ears: normal TM's and external ear canals both ears  Nose: Nares normal. Septum midline. Mucosa normal. No drainage or sinus tenderness.  Throat: lips, mucosa, and tongue normal; teeth and gums normal  Neck: no adenopathy, no carotid bruit, no JVD, supple, symmetrical, trachea midline and thyroid not enlarged, symmetric, no tenderness/mass/nodules  No CVA tenderness.  Lungs: clear to auscultation bilaterally  Breasts: normal appearance, no masses or  tenderness Heart: regular rate and rhythm, S1, S2 normal, no murmur, click, rub or gallop  Abdomen: soft, non-tender; bowel sounds normal; no masses, no organomegaly  Musculoskeletal: ROM normal in all joints, no crepitus, no deformity, Normal muscle strengthen. Back  is symmetric, no curvature. Skin: Skin color, texture, turgor normal. No rashes or lesions  Lymph nodes: Cervical, supraclavicular, and axillary nodes normal.  Neurologic: CN 2 -12 Normal, Normal symmetric reflexes. Normal coordination and gait  Psych: Alert & Oriented x 3, Mood appear stable.    Assessment:    Annual wellness medicare exam   Plan:    During the course of the visit the patient was educated and counseled about appropriate screening and preventive services including:   Annual flu vaccine     Patient Instructions (the written plan) was given to the patient.  Medicare Attestation  I have personally reviewed:  The patient's medical and social history  Their use of alcohol,  tobacco or illicit drugs  Their current medications and supplements  The patient's functional ability including ADLs,fall risks, home safety risks, cognitive, and hearing and visual impairment  Diet and physical activities  Evidence for depression or mood disorders  The patient's weight, height, BMI, and visual acuity have been recorded in the chart. I have made referrals, counseling, and provided education to the patient based on review of the above and I have provided the patient with a written personalized care plan for preventive services.

## 2020-05-18 LAB — POCT URINALYSIS DIPSTICK
Appearance: NEGATIVE
Bilirubin, UA: NEGATIVE
Blood, UA: NEGATIVE
Glucose, UA: NEGATIVE
Ketones, UA: NEGATIVE
Leukocytes, UA: NEGATIVE
Nitrite, UA: NEGATIVE
Odor: NEGATIVE
Protein, UA: NEGATIVE
Spec Grav, UA: 1.01 (ref 1.010–1.025)
Urobilinogen, UA: 0.2 E.U./dL
pH, UA: 6.5 (ref 5.0–8.0)

## 2020-05-31 ENCOUNTER — Telehealth: Payer: Self-pay | Admitting: Internal Medicine

## 2020-05-31 NOTE — Telephone Encounter (Signed)
Faxed 21 pages of Medical Records to [facility] [phone], phone [phone]. Current CPE with Labs

## 2020-06-12 DIAGNOSIS — Z1231 Encounter for screening mammogram for malignant neoplasm of breast: Secondary | ICD-10-CM | POA: Diagnosis not present

## 2020-06-12 LAB — HM MAMMOGRAPHY

## 2020-06-15 ENCOUNTER — Encounter: Payer: Self-pay | Admitting: Internal Medicine

## 2020-06-23 ENCOUNTER — Other Ambulatory Visit: Payer: Self-pay | Admitting: Internal Medicine

## 2020-06-23 NOTE — Telephone Encounter (Signed)
Last refill 07/04/2019, had CPE on 05/17/2020.

## 2020-07-12 ENCOUNTER — Telehealth: Payer: Self-pay | Admitting: Internal Medicine

## 2020-07-12 MED ORDER — BENZONATATE 100 MG PO CAPS
100.0000 mg | ORAL_CAPSULE | Freq: Two times a day (BID) | ORAL | 0 refills | Status: DC | PRN
Start: 2020-07-12 — End: 2020-11-16

## 2020-07-12 MED ORDER — CIPROFLOXACIN HCL 250 MG PO TABS
ORAL_TABLET | ORAL | 0 refills | Status: DC
Start: 2020-07-12 — End: 2020-11-16

## 2020-07-12 MED ORDER — PREDNISONE 10 MG PO TABS
ORAL_TABLET | ORAL | 0 refills | Status: DC
Start: 2020-07-12 — End: 2020-11-16

## 2020-07-12 MED ORDER — AZITHROMYCIN 250 MG PO TABS
ORAL_TABLET | ORAL | 0 refills | Status: DC
Start: 2020-07-12 — End: 2020-11-16

## 2020-07-12 NOTE — Telephone Encounter (Signed)
Patient is going to [place] this coming Friday to see family and is requesting meds for travel. Calling in  Zithromax Z-pak 2 po day 1 followed by one po days 2-5. Calling in Prednisone 10 mg (# 21) tabs take in tapering course as directed 6-5-4-3-2-1 taper for respiratory congestion. Tessalon perles 2 po 3 times daily if needed for cough. Cipro 250 mg #14 one po twice daily if needed for UTI

## 2020-07-17 ENCOUNTER — Other Ambulatory Visit: Payer: Self-pay | Admitting: Internal Medicine

## 2020-09-30 ENCOUNTER — Other Ambulatory Visit: Payer: Self-pay | Admitting: Internal Medicine

## 2020-11-12 ENCOUNTER — Other Ambulatory Visit: Payer: Medicare HMO | Admitting: Internal Medicine

## 2020-11-12 ENCOUNTER — Other Ambulatory Visit: Payer: Self-pay

## 2020-11-12 DIAGNOSIS — E119 Type 2 diabetes mellitus without complications: Secondary | ICD-10-CM | POA: Diagnosis not present

## 2020-11-12 DIAGNOSIS — E7800 Pure hypercholesterolemia, unspecified: Secondary | ICD-10-CM | POA: Diagnosis not present

## 2020-11-12 DIAGNOSIS — E785 Hyperlipidemia, unspecified: Secondary | ICD-10-CM | POA: Diagnosis not present

## 2020-11-12 DIAGNOSIS — E039 Hypothyroidism, unspecified: Secondary | ICD-10-CM

## 2020-11-12 DIAGNOSIS — E1169 Type 2 diabetes mellitus with other specified complication: Secondary | ICD-10-CM | POA: Diagnosis not present

## 2020-11-13 LAB — LIPID PANEL
Cholesterol: 200 mg/dL — ABNORMAL HIGH (ref <200)
HDL: 58 mg/dL (ref >=50)
LDL Cholesterol (Calc): 112 mg/dL (calc) — ABNORMAL HIGH
Non-HDL Cholesterol (Calc): 142 mg/dL (calc) — ABNORMAL HIGH (ref <130)
Total CHOL/HDL Ratio: 3.4 (calc) (ref <5.0)
Triglycerides: 189 mg/dL — ABNORMAL HIGH (ref <150)

## 2020-11-13 LAB — HEPATIC FUNCTION PANEL
AG Ratio: 1.7 (calc) (ref 1.0–2.5)
ALT: 8 U/L (ref 6–29)
AST: 21 U/L (ref 10–35)
Albumin: 5 g/dL (ref 3.6–5.1)
Alkaline phosphatase (APISO): 65 U/L (ref 37–153)
Bilirubin, Direct: 0.2 mg/dL (ref 0.0–0.2)
Globulin: 2.9 g/dL (calc) (ref 1.9–3.7)
Indirect Bilirubin: 0.8 mg/dL (calc) (ref 0.2–1.2)
Total Bilirubin: 1 mg/dL (ref 0.2–1.2)
Total Protein: 7.9 g/dL (ref 6.1–8.1)

## 2020-11-13 LAB — HEMOGLOBIN A1C
Hgb A1c MFr Bld: 7.2 % of total Hgb — ABNORMAL HIGH (ref <5.7)
Mean Plasma Glucose: 160 mg/dL
eAG (mmol/L): 8.9 mmol/L

## 2020-11-13 LAB — MICROALBUMIN / CREATININE URINE RATIO
Creatinine, Urine: 15 mg/dL — ABNORMAL LOW (ref 20–275)
Microalb, Ur: <0.2 mg/dL

## 2020-11-13 LAB — TSH: TSH: 2.16 mIU/L (ref 0.40–4.50)

## 2020-11-15 ENCOUNTER — Ambulatory Visit: Payer: Medicare HMO | Admitting: Internal Medicine

## 2020-11-16 ENCOUNTER — Other Ambulatory Visit: Payer: Self-pay

## 2020-11-16 ENCOUNTER — Encounter: Payer: Self-pay | Admitting: Internal Medicine

## 2020-11-16 ENCOUNTER — Ambulatory Visit (INDEPENDENT_AMBULATORY_CARE_PROVIDER_SITE_OTHER): Payer: Medicare HMO | Admitting: Internal Medicine

## 2020-11-16 VITALS — BP 120/80 | HR 88 | Temp 97.7°F | Ht 62.0 in | Wt 150.0 lb

## 2020-11-16 DIAGNOSIS — M19012 Primary osteoarthritis, left shoulder: Secondary | ICD-10-CM

## 2020-11-16 DIAGNOSIS — E039 Hypothyroidism, unspecified: Secondary | ICD-10-CM | POA: Diagnosis not present

## 2020-11-16 DIAGNOSIS — E785 Hyperlipidemia, unspecified: Secondary | ICD-10-CM | POA: Diagnosis not present

## 2020-11-16 DIAGNOSIS — E1169 Type 2 diabetes mellitus with other specified complication: Secondary | ICD-10-CM

## 2020-11-16 NOTE — Progress Notes (Signed)
   Subjective:    Patient ID: Paige Ali, female    DOB: October 20, 1946, 75 y.o.   MRN: [id number]  HPI 75 year old Female  for 6 month follow up. She traveled to [place] without incident and had a good time with family. 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  She has history of right shoulder arthropathy and has seen Dr. Noemi Chapel in 2018 This has resolved without surgery. She was diagnosed with acute traumatic rotator cuff tear with biceps tendon subluxation and impingement as well as right shoulder AC joint primary localized osteoarthritis. Surgery was recommended but she declined and with time, improved.  Now has similar symptoms left shoulder with pain lower aspect of deltoid and decreased range of motion left shoulder. No known fall or injury.  She has hypothyroidism, hyperlipidemia, diabetes mellitus. Her recent TSH is normal on levothyroxine 75 mcg daily. She does have Mobic to take for musculoskeletal pain. She has hyperlipidemia treated with Lipitor 20 mg daily. She has type 2 diabetes mellitus treated with Glucotrol XL 2.5 mg daily with breakfast. She is on generic Lipitor 20 mg daily for hyperlipidemia. Her hemoglobin A1c has increased from 6.8% to 7.2% from July 2 now. Likely has been eating well during the holidays. Was doing a lot of baking. Her liver functions are normal on Lipitor. Total cholesterol has increased from 191 and To 200. Triglycerides increased from 141 in July to 189. LDL stayed about the same at 112 and was 108 in July. She will continue to watch her diet. She exercises either by riding the bicycle or walking. She stays in good shape.        Review of Systems issues with left shoulder- has pain lower deltoid and decreased range of motion. Rides bicycle around town for exercise.     Objective:   Physical Exam Blood pressure 120/80 pulse 88 regular temperature 97.7 degrees pulse oximetry 98%  weight 150 pounds height 5 feet 2 inches BMI 27.44  Left shoulder decreased range of motion. Having a hard time raising left shoulder up over her head. Has tenderness lower aspect of left deltoid. Muscle strength left upper extremity appears to be normal.  No thyromegaly. No carotid bruits. Chest is clear to auscultation. Cardiac exam regular rate and rhythm normal S1 and S2 without murmurs or gallops. No lower extremity edema.       Assessment & Plan:  Hypothyroidism-stable with thyroid replacement medication. Return in 6 months for follow-up.  Left shoulder arthropathy-refer to Dr. Mardelle Matte for evaluation. Has Mobic to take for pain.  Hyperlipidemia-continue to watch diet. She does not exercise regularly. Continue with Lipitor generic 20 mg daily. Will return in 6 months for wellness visit and fasting labs.  Type 2 diabetes mellitus-hemoglobin A1c increased a bit at 7.2%. Continue glipizide XL and follow-up in 6 months. Continue to watch diet and exercise.  Tells me she is changing insurance from [place] to [employer] and will need mail order prescription sent to [employer] in the near future when she gets her new card.  Plan: She will return in 6 months for Medicare wellness and health maintenance exam with fasting labs.

## 2020-11-16 NOTE — Patient Instructions (Addendum)
Referral to Dr. Mardelle Matte regarding left shoulder arthropathy. Continue with Mobic for pain. May apply heating pad or warm compresses to left shoulder.  Continue current medications. Watch diet and continue your exercise regimen.  It was a pleasure to see you today.  Return in 6 months for Medicare wellness and health maintenance exam along with fasting labs.

## 2020-11-18 DIAGNOSIS — M542 Cervicalgia: Secondary | ICD-10-CM | POA: Diagnosis not present

## 2020-11-18 DIAGNOSIS — M7582 Other shoulder lesions, left shoulder: Secondary | ICD-10-CM | POA: Diagnosis not present

## 2020-11-24 DIAGNOSIS — M25512 Pain in left shoulder: Secondary | ICD-10-CM | POA: Diagnosis not present

## 2020-11-24 DIAGNOSIS — M7542 Impingement syndrome of left shoulder: Secondary | ICD-10-CM | POA: Diagnosis not present

## 2020-11-24 DIAGNOSIS — M6281 Muscle weakness (generalized): Secondary | ICD-10-CM | POA: Diagnosis not present

## 2020-12-02 DIAGNOSIS — M25512 Pain in left shoulder: Secondary | ICD-10-CM | POA: Diagnosis not present

## 2020-12-02 DIAGNOSIS — M7542 Impingement syndrome of left shoulder: Secondary | ICD-10-CM | POA: Diagnosis not present

## 2020-12-02 DIAGNOSIS — M6281 Muscle weakness (generalized): Secondary | ICD-10-CM | POA: Diagnosis not present

## 2020-12-03 ENCOUNTER — Telehealth: Payer: Self-pay | Admitting: Internal Medicine

## 2020-12-03 NOTE — Telephone Encounter (Signed)
Paige Ali [phone]  Kerensa called to say her new pharmacy is listed below according to her new insurance, please send all prescriptions there.  [facility] [phone] - Fax

## 2020-12-30 ENCOUNTER — Telehealth: Payer: Self-pay | Admitting: Internal Medicine

## 2020-12-30 DIAGNOSIS — E785 Hyperlipidemia, unspecified: Secondary | ICD-10-CM

## 2020-12-30 DIAGNOSIS — E1169 Type 2 diabetes mellitus with other specified complication: Secondary | ICD-10-CM

## 2020-12-30 MED ORDER — ATORVASTATIN CALCIUM 20 MG PO TABS: 20.0000 mg | ORAL_TABLET | Freq: Every day | ORAL | 1 refills | Status: DC

## 2020-12-30 MED ORDER — LEVOTHYROXINE SODIUM 75 MCG PO TABS: 75.0000 ug | ORAL_TABLET | Freq: Every day | ORAL | 1 refills | Status: DC

## 2020-12-30 MED ORDER — GLIPIZIDE ER 2.5 MG PO TB24: ORAL_TABLET | ORAL | 1 refills | Status: DC

## 2020-12-30 MED ORDER — METFORMIN HCL 500 MG PO TABS: ORAL_TABLET | ORAL | 1 refills | Status: DC

## 2020-12-30 MED ORDER — TRUE METRIX BLOOD GLUCOSE TEST VI STRP: ORAL_STRIP | 12 refills | Status: DC

## 2020-12-30 NOTE — Telephone Encounter (Signed)
Received Fax RX request from -- [place] phone [phone], fax [phone] [place] [place] [address]  Medication - atorvastatin (LIPITOR) 20 MG tablet glipiZIDE (GLUCOTROL XL) 2.5 MG 24 hr tablet metFORMIN (GLUCOPHAGE) 500 MG tablet levothyroxine (SYNTHROID) 75 MCG tablet True Metrix Gluco Strips 50's  Last Refill -  Last OV - 11/16/2020  Last CPE - 05/17/2021  Next Appointment - 05/19/2021

## 2020-12-30 NOTE — Telephone Encounter (Signed)
Please refill these for 6 months

## 2021-03-23 ENCOUNTER — Telehealth: Payer: Self-pay | Admitting: Internal Medicine

## 2021-03-23 DIAGNOSIS — Z7184 Encounter for health counseling related to travel: Secondary | ICD-10-CM

## 2021-03-23 MED ORDER — BENZONATATE 100 MG PO CAPS: 200.0000 mg | ORAL_CAPSULE | Freq: Three times a day (TID) | ORAL | 0 refills | Status: DC | PRN

## 2021-03-23 MED ORDER — AZITHROMYCIN 250 MG PO TABS: ORAL_TABLET | ORAL | 0 refills | Status: AC

## 2021-03-23 NOTE — Telephone Encounter (Signed)
She leaves for [place] tomorrow. She called and wanted Paxlovid for the trip as her sister got Covid there. Told her, we could not call in Paxlovid for the trip. She would need a positive Covid test first according to regulations I read. It is  emergency use authorization for those at high risk of hospitalization from Covid-19 with positive test.  I am willing to send her a Zithromax Z pak and Tessalon perles for her trip. She agrees to this. [email or web address]

## 2021-05-16 ENCOUNTER — Other Ambulatory Visit: Payer: Self-pay

## 2021-05-16 ENCOUNTER — Other Ambulatory Visit: Payer: Medicare Other | Admitting: Internal Medicine

## 2021-05-16 DIAGNOSIS — E119 Type 2 diabetes mellitus without complications: Secondary | ICD-10-CM | POA: Diagnosis not present

## 2021-05-16 DIAGNOSIS — E785 Hyperlipidemia, unspecified: Secondary | ICD-10-CM

## 2021-05-16 DIAGNOSIS — Z0000 Encounter for general adult medical examination without abnormal findings: Secondary | ICD-10-CM

## 2021-05-16 DIAGNOSIS — E039 Hypothyroidism, unspecified: Secondary | ICD-10-CM | POA: Diagnosis not present

## 2021-05-16 DIAGNOSIS — E1169 Type 2 diabetes mellitus with other specified complication: Secondary | ICD-10-CM | POA: Diagnosis not present

## 2021-05-17 NOTE — Addendum Note (Signed)
Addended by: Mady Haagensen on: 05/17/2021 09:56 AM   Modules accepted: Orders

## 2021-05-18 LAB — COMPLETE METABOLIC PANEL WITH GFR
AG Ratio: 1.9 (calc) (ref 1.0–2.5)
ALT: 6 U/L (ref 6–29)
AST: 18 U/L (ref 10–35)
Albumin: 4.7 g/dL (ref 3.6–5.1)
Alkaline phosphatase (APISO): 59 U/L (ref 37–153)
BUN: 18 mg/dL (ref 7–25)
CO2: 26 mmol/L (ref 20–32)
Calcium: 9.4 mg/dL (ref 8.6–10.4)
Chloride: 104 mmol/L (ref 98–110)
Creat: 0.72 mg/dL (ref 0.60–1.00)
Globulin: 2.5 g/dL (calc) (ref 1.9–3.7)
Glucose, Bld: 129 mg/dL — ABNORMAL HIGH (ref 65–99)
Potassium: 4.5 mmol/L (ref 3.5–5.3)
Sodium: 139 mmol/L (ref 135–146)
Total Bilirubin: 0.9 mg/dL (ref 0.2–1.2)
Total Protein: 7.2 g/dL (ref 6.1–8.1)
eGFR: 88 mL/min/{1.73_m2} (ref >=60)

## 2021-05-18 LAB — LIPID PANEL
Cholesterol: 191 mg/dL (ref <200)
HDL: 57 mg/dL (ref >=50)
LDL Cholesterol (Calc): 107 mg/dL (calc) — ABNORMAL HIGH
Non-HDL Cholesterol (Calc): 134 mg/dL (calc) — ABNORMAL HIGH (ref <130)
Total CHOL/HDL Ratio: 3.4 (calc) (ref <5.0)
Triglycerides: 157 mg/dL — ABNORMAL HIGH (ref <150)

## 2021-05-18 LAB — TEST AUTHORIZATION

## 2021-05-18 LAB — CBC WITH DIFFERENTIAL/PLATELET
Absolute Monocytes: 451 cells/uL (ref 200–950)
Basophils Absolute: 28 cells/uL (ref 0–200)
Basophils Relative: 0.5 %
Eosinophils Absolute: 171 cells/uL (ref 15–500)
Eosinophils Relative: 3.1 %
HCT: 38.4 % (ref 35.0–45.0)
Hemoglobin: 12.5 g/dL (ref 11.7–15.5)
Lymphs Abs: 2426 cells/uL (ref 850–3900)
MCH: 30.8 pg (ref 27.0–33.0)
MCHC: 32.6 g/dL (ref 32.0–36.0)
MCV: 94.6 fL (ref 80.0–100.0)
MPV: 10 fL (ref 7.5–12.5)
Monocytes Relative: 8.2 %
Neutro Abs: 2426 cells/uL (ref 1500–7800)
Neutrophils Relative %: 44.1 %
Platelets: 258 10*3/uL (ref 140–400)
RBC: 4.06 10*6/uL (ref 3.80–5.10)
RDW: 12.3 % (ref 11.0–15.0)
Total Lymphocyte: 44.1 %
WBC: 5.5 10*3/uL (ref 3.8–10.8)

## 2021-05-18 LAB — TSH: TSH: 0.92 mIU/L (ref 0.40–4.50)

## 2021-05-18 LAB — HEMOGLOBIN A1C W/OUT EAG: Hgb A1c MFr Bld: 6.9 % of total Hgb — ABNORMAL HIGH (ref <5.7)

## 2021-05-19 ENCOUNTER — Other Ambulatory Visit: Payer: Self-pay

## 2021-05-19 ENCOUNTER — Encounter: Payer: Self-pay | Admitting: Internal Medicine

## 2021-05-19 ENCOUNTER — Ambulatory Visit (INDEPENDENT_AMBULATORY_CARE_PROVIDER_SITE_OTHER): Payer: Medicare Other | Admitting: Internal Medicine

## 2021-05-19 VITALS — BP 120/80 | HR 98 | Ht 62.0 in | Wt 147.0 lb

## 2021-05-19 DIAGNOSIS — Z1211 Encounter for screening for malignant neoplasm of colon: Secondary | ICD-10-CM | POA: Diagnosis not present

## 2021-05-19 DIAGNOSIS — E785 Hyperlipidemia, unspecified: Secondary | ICD-10-CM

## 2021-05-19 DIAGNOSIS — E039 Hypothyroidism, unspecified: Secondary | ICD-10-CM

## 2021-05-19 DIAGNOSIS — E1169 Type 2 diabetes mellitus with other specified complication: Secondary | ICD-10-CM | POA: Diagnosis not present

## 2021-05-19 DIAGNOSIS — Z0000 Encounter for general adult medical examination without abnormal findings: Secondary | ICD-10-CM | POA: Diagnosis not present

## 2021-05-19 LAB — POCT URINALYSIS DIPSTICK
Appearance: NEGATIVE
Bilirubin, UA: NEGATIVE
Blood, UA: NEGATIVE
Glucose, UA: NEGATIVE
Ketones, UA: NEGATIVE
Leukocytes, UA: NEGATIVE
Nitrite, UA: NEGATIVE
Odor: NEGATIVE
Protein, UA: NEGATIVE
Spec Grav, UA: 1.015 (ref 1.010–1.025)
Urobilinogen, UA: 0.2 E.U./dL
pH, UA: 6.5 (ref 5.0–8.0)

## 2021-05-19 LAB — HEMOCCULT GUIAC POC 1CARD (OFFICE): Fecal Occult Blood, POC: NEGATIVE

## 2021-05-19 NOTE — Patient Instructions (Addendum)
It was a pleasure to see you today.  Mammogram ordered faxed to [facility] where you have been previously.  Continue current medications as previously prescribed.  Watch diet and continue your exercise program.  Follow-up here in 6 months.

## 2021-05-19 NOTE — Progress Notes (Signed)
Subjective:    Patient ID: Paige Ali, female    DOB: [birth year], 75 y.o.   MRN: [id number]  HPI  75 year old Female for Medicare wellness and health maintenance exam. Has had 4 Covid-19 vaccines. Tdap up to date.  She has a history of hypothyroidism, impaired glucose tolerance and hyperlipidemia.  Her general health is excellent.  She has a history of right shoulder arthropathy and has seen orthopedist in the past.  She tries to exercise regularly with walking and riding her bicycle.  Old records indicate she became glucose intolerant around 2006.  Social history: She is a native of [place].  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  Daughter lives in [place].  Son lives here.  She had colonoscopy in 2020.  Social history: She has been in the [place] for some 20 years.  She has a college degree.  She is a widow.  She does not smoke.  Social alcohol consumption.  Enjoys swimming in addition to her other modes of exercise.  Family history: Her father passed away at nearly 39 years of age due to complications of a leg infection.  He lived in [place].  Her mother is still living with history of diabetes.  2 brothers and 2 sisters that are healthy.  Mammogram due now.  Last one was done June 12, 2020.  Order sent  by fax to [facility].  Her labs are reviewed and are essentially normal.  Total cholesterol is 191, triglycerides very slightly elevated at 157 and LDL cholesterol 107.  HDL is 57.  TSH is normal.  Hemoglobin A1c 6.9% and was 7.2% in January 2022.  Needs to continue to work on diet and exercise.  We can increase glipizide if this is not improving.  She is on metformin 500 mg twice daily with a meal.  She is on Lipitor generic 20 mg daily.  Lipids are nearly normal on this regimen  TSH is stable and within normal limits on thyroid replacement medication 75 mcg daily of levothyroxine.      Review of Systems no new complaints-feels well     Objective:   Physical  Exam  BP 120/80, pulse 98 and regular, pulse oximetry 95%, weight 147 pounds BMI 26.89.  Skin: Warm and dry.  No cervical adenopathy.  TMs clear.  Pharynx is clear.  Neck is supple.  No thyromegaly.  No carotid bruits.  Chest is clear to auscultation.  Cardiac exam: Regular rate and rhythm without ectopy or murmur.  Breasts are without masses.  Abdomen is soft nondistended without hepatosplenomegaly masses or tenderness.  Neuro is intact without focal deficits.  Extremities are without pitting edema or deformity.  Affect thought and judgment appear to be normal.      Assessment & Plan:  Hyperlipidemia treated with Lipitor 20 mg daily.  Continue diet and exercise efforts and no change in dose of generic Lipitor.  Impaired glucose tolerance/diabetes mellitus.  Hemoglobin A1c 6.9%.  Has been as high as 7.2% in January 2022.  Currently on glipizide 2.5 mg XL daily and metformin 500 mg twice daily.  Would like to see glucose control just a bit better.  We can consider increasing glipizide in the future.  Hypothyroidism TSH stable at 0.92 on levothyroxine 75 mcg daily.  Plan: Continue current medications.  Continue to work on diet and exercise regimen.  No change in medications at present time.  Follow-up in 6 months.   Subjective:   Patient presents for  Medicare Annual/Subsequent preventive examination.  Review Past Medical/Family/Social: See above  Risk Factors  Current exercise habits: Walks and rides her bicycle Dietary issues discussed: Yes  Cardiac risk factors: Hyperlipidemia and impaired glucose tolerance  Depression Screen  (Note: if answer to either of the following is "Yes", a more complete depression screening is indicated)   Over the past two weeks, have you felt down, depressed or hopeless? No  Over the past two weeks, have you felt little interest or pleasure in doing things? No Have you lost interest or pleasure in daily life? No Do you often feel hopeless? No Do you  cry easily over simple problems? No   Activities of Daily Living  In your present state of health, do you have any difficulty performing the following activities?:   Driving? No  Managing money? No  Feeding yourself? No  Getting from bed to chair? No  Climbing a flight of stairs? No  Preparing food and eating?: No  Bathing or showering? No  Getting dressed: No  Getting to the toilet? No  Using the toilet:No  Moving around from place to place: No  In the past year have you fallen or had a near fall?:No  Are you sexually active? No  Do you have more than one partner? No   Hearing Difficulties: No  Do you often ask people to speak up or repeat themselves? No  Do you experience ringing or noises in your ears? No  Do you have difficulty understanding soft or whispered voices? No  Do you feel that you have a problem with memory? No Do you often misplace items? No    Home Safety:  Do you have a smoke alarm at your residence? Yes Do you have grab bars in the bathroom?  Yes Do you have throw rugs in your house?  No   Cognitive Testing  Alert? Yes Normal Appearance?Yes  Oriented to person? Yes Place? Yes  Time? Yes  Recall of three objects? Yes  Can perform simple calculations? Yes  Displays appropriate judgment?Yes  Can read the correct time from a watch face?Yes   List the Names of Other Physician/Practitioners you currently use:  See referral list for the physicians patient is currently seeing.  No specialists see her for chronic conditions   Review of Systems: See above   Objective:     General appearance: Appears stated age and mildly obese  Head: Normocephalic, without obvious abnormality, atraumatic  Eyes: conj clear, EOMi PEERLA  Ears: normal TM's and external ear canals both ears  Nose: Nares normal. Septum midline. Mucosa normal. No drainage or sinus tenderness.  Throat: lips, mucosa, and tongue normal; teeth and gums normal  Neck: no adenopathy, no carotid  bruit, no JVD, supple, symmetrical, trachea midline and thyroid not enlarged, symmetric, no tenderness/mass/nodules  No CVA tenderness.  Lungs: clear to auscultation bilaterally  Breasts: normal appearance, no masses or tenderness Heart: regular rate and rhythm, S1, S2 normal, no murmur, click, rub or gallop  Abdomen: soft, non-tender; bowel sounds normal; no masses, no organomegaly  Musculoskeletal: ROM normal in all joints, no crepitus, no deformity, Normal muscle strengthen. Back  is symmetric, no curvature. Skin: Skin color, texture, turgor normal. No rashes or lesions  Lymph nodes: Cervical, supraclavicular, and axillary nodes normal.  Neurologic: CN 2 -12 Normal, Normal symmetric reflexes. Normal coordination and gait  Psych: Alert & Oriented x 3, Mood appear stable.    Assessment:    Annual wellness medicare exam   Plan:  During the course of the visit the patient was educated and counseled about appropriate screening and preventive services including:  Mammogram ordered at [facility] via fax  Has had 4 COVID vaccines  Has had 2 pneumococcal vaccines and tetanus immunization is up-to-date until 2023  Has had Shingrix vaccine  Gets annual flu vaccine      Patient Instructions (the written plan) was given to the patient.  Medicare Attestation  I have personally reviewed:  The patient's medical and social history  Their use of alcohol, tobacco or illicit drugs  Their current medications and supplements  The patient's functional ability including ADLs,fall risks, home safety risks, cognitive, and hearing and visual impairment  Diet and physical activities  Evidence for depression or mood disorders  The patient's weight, height, BMI, and visual acuity have been recorded in the chart. I have made referrals, counseling, and provided education to the patient based on review of the above and I have provided the patient with a written personalized care plan for preventive  services.

## 2021-06-09 ENCOUNTER — Ambulatory Visit (INDEPENDENT_AMBULATORY_CARE_PROVIDER_SITE_OTHER): Payer: Medicare Other | Admitting: Internal Medicine

## 2021-06-09 ENCOUNTER — Telehealth: Payer: Self-pay

## 2021-06-09 ENCOUNTER — Encounter: Payer: Self-pay | Admitting: Internal Medicine

## 2021-06-09 ENCOUNTER — Other Ambulatory Visit: Payer: Self-pay

## 2021-06-09 VITALS — HR 122 | Temp 97.7°F

## 2021-06-09 DIAGNOSIS — J22 Unspecified acute lower respiratory infection: Secondary | ICD-10-CM

## 2021-06-09 DIAGNOSIS — R059 Cough, unspecified: Secondary | ICD-10-CM

## 2021-06-09 MED ORDER — BENZONATATE 100 MG PO CAPS: 100.0000 mg | ORAL_CAPSULE | Freq: Two times a day (BID) | ORAL | 1 refills | Status: DC | PRN

## 2021-06-09 MED ORDER — AZITHROMYCIN 250 MG PO TABS: ORAL_TABLET | ORAL | 0 refills | Status: AC

## 2021-06-09 NOTE — Progress Notes (Signed)
   Subjective:    Patient ID: Paige Ali, female    DOB: [birth year], 75 y.o.   MRN: [id number]  HPI 75 year old Female in good general health has come down with cough with slight discolored sputum.  She was cleaning her home and moving some items around.  May have been exposed to dust.  She took over-the-counter antihistamine with some relief but is still coughing and is concerned.  She took a home COVID test which was negative.  No fever or chills.  Continues to be active.  She has had 4 COVID vaccines.  No travel history recently.  She has a history of hypothyroidism and takes thyroid replacement medication, history of glucose intolerance and hyperlipidemia.  Her general health is excellent.    Review of Systems no nausea, vomiting, diarrhea, dysgeusia     Objective:   Physical Exam She is afebrile.  Respiratory rate is normal.  Pulse oximetry is also normal.  She is in no acute distress.  TMs are clear.  Neck is supple.  Chest is clear to auscultation without rales or wheezing.       Assessment & Plan:  Acute lower respiratory infection  Possible allergic rhinitis  Rule out COVID-19  Plan: COVID PCR test obtained and results pending.  Will take 2 to 3 days to result this test.  She should quarantine until then.  I have prescribed Zithromax Z-PAK 2 tabs day 1 followed by 1 tab days 2 through 5 for lower respiratory infection.  I have prescribed Tessalon Perles 100 mg to take up to 3 times daily as needed for cough with 1 refill.  Rest and drink plenty of fluids.

## 2021-06-09 NOTE — Telephone Encounter (Signed)
Scheduled

## 2021-06-09 NOTE — Patient Instructions (Signed)
Tessalon perles up to 3 times daily as needed for cough.  Take Zithromax Z-PAK 2 tabs day 1 followed by 1 tab days 2 through 5.  Rest and drink fluids.  COVID-19 PCR test is pending.  It may take 2 to 3 days for results to be obtained.

## 2021-06-09 NOTE — Telephone Encounter (Signed)
Patient called has had a cough for 2 weeks she did a COVID test last week and it was negative no other symptoms. She said she had family over last week. She is requesting an appointment.  I asked patient to do a COVID test today and call back with results.

## 2021-06-10 LAB — SARS-COV-2 RNA,(COVID-19) QUALITATIVE NAAT: SARS CoV2 RNA: NOT DETECTED

## 2021-06-22 ENCOUNTER — Telehealth: Payer: Self-pay | Admitting: Internal Medicine

## 2021-06-22 NOTE — Telephone Encounter (Signed)
Paige Ali [phone]  Fenna called to say she has been trying to get a One Touch Blood Sugar Monitor every since she changed pharmacies 6 months ago and still does not have one. I can not find anything in chart about it. She said maybe this phone number of pharmacy would help [phone].

## 2021-06-27 NOTE — Telephone Encounter (Signed)
I have sent a prescription for this.

## 2021-06-30 NOTE — Telephone Encounter (Signed)
[facility] is the name that the pharmacy. Pharmacy phone number [phone]. Pt said she talked to the pharmacy said they did not receive the script. Please advise. She said the pharmacy is with [facility]

## 2021-07-07 ENCOUNTER — Other Ambulatory Visit: Payer: Self-pay

## 2021-07-07 MED ORDER — ONETOUCH ULTRA VI STRP: ORAL_STRIP | 12 refills | Status: DC

## 2021-07-09 DIAGNOSIS — Z1231 Encounter for screening mammogram for malignant neoplasm of breast: Secondary | ICD-10-CM | POA: Diagnosis not present

## 2021-07-09 LAB — HM MAMMOGRAPHY

## 2021-07-11 ENCOUNTER — Encounter: Payer: Self-pay | Admitting: Internal Medicine

## 2021-07-11 NOTE — Telephone Encounter (Signed)
This encounter was created in error - please disregard.

## 2021-07-15 ENCOUNTER — Other Ambulatory Visit: Payer: Self-pay

## 2021-07-15 DIAGNOSIS — E119 Type 2 diabetes mellitus without complications: Secondary | ICD-10-CM

## 2021-07-15 MED ORDER — ONETOUCH DELICA LANCETS 30G MISC: 3 refills | Status: AC

## 2021-07-15 MED ORDER — ONETOUCH ULTRA 2 W/DEVICE KIT: PACK | 2 refills | Status: AC

## 2021-07-15 MED ORDER — ONETOUCH DELICA LANCING DEV MISC: 3 refills | Status: AC

## 2021-07-15 NOTE — Telephone Encounter (Signed)
[facility]

## 2021-07-19 ENCOUNTER — Other Ambulatory Visit: Payer: Self-pay | Admitting: Internal Medicine

## 2021-08-05 ENCOUNTER — Other Ambulatory Visit: Payer: Self-pay | Admitting: Internal Medicine

## 2021-09-09 ENCOUNTER — Telehealth: Payer: Self-pay

## 2021-09-09 NOTE — Telephone Encounter (Signed)
Insurance states she is overdue for a refill but she states that she takes it every morning at 5 am and has not missed any days.

## 2021-11-18 ENCOUNTER — Other Ambulatory Visit: Payer: Self-pay

## 2021-11-18 ENCOUNTER — Other Ambulatory Visit: Payer: Medicare Other | Admitting: Internal Medicine

## 2021-11-18 DIAGNOSIS — E1169 Type 2 diabetes mellitus with other specified complication: Secondary | ICD-10-CM

## 2021-11-18 DIAGNOSIS — E785 Hyperlipidemia, unspecified: Secondary | ICD-10-CM | POA: Diagnosis not present

## 2021-11-18 NOTE — Addendum Note (Signed)
Addended by: Angus Seller on: 11/18/2021 10:53 AM   Modules accepted: Orders

## 2021-11-19 LAB — HEPATIC FUNCTION PANEL
AG Ratio: 1.7 (calc) (ref 1.0–2.5)
ALT: 6 U/L (ref 6–29)
AST: 19 U/L (ref 10–35)
Albumin: 4.7 g/dL (ref 3.6–5.1)
Alkaline phosphatase (APISO): 62 U/L (ref 37–153)
Bilirubin, Direct: 0.2 mg/dL (ref 0.0–0.2)
Globulin: 2.7 g/dL (calc) (ref 1.9–3.7)
Indirect Bilirubin: 0.9 mg/dL (calc) (ref 0.2–1.2)
Total Bilirubin: 1.1 mg/dL (ref 0.2–1.2)
Total Protein: 7.4 g/dL (ref 6.1–8.1)

## 2021-11-19 LAB — HEMOGLOBIN A1C
Hgb A1c MFr Bld: 7.1 % of total Hgb — ABNORMAL HIGH (ref <5.7)
Mean Plasma Glucose: 157 mg/dL
eAG (mmol/L): 8.7 mmol/L

## 2021-11-19 LAB — LIPID PANEL
Cholesterol: 192 mg/dL (ref <200)
HDL: 59 mg/dL (ref >=50)
LDL Cholesterol (Calc): 102 mg/dL (calc) — ABNORMAL HIGH
Non-HDL Cholesterol (Calc): 133 mg/dL (calc) — ABNORMAL HIGH (ref <130)
Total CHOL/HDL Ratio: 3.3 (calc) (ref <5.0)
Triglycerides: 185 mg/dL — ABNORMAL HIGH (ref <150)

## 2021-11-21 ENCOUNTER — Encounter: Payer: Self-pay | Admitting: Internal Medicine

## 2021-11-21 ENCOUNTER — Other Ambulatory Visit: Payer: Self-pay

## 2021-11-21 ENCOUNTER — Ambulatory Visit (INDEPENDENT_AMBULATORY_CARE_PROVIDER_SITE_OTHER): Payer: Medicare Other | Admitting: Internal Medicine

## 2021-11-21 VITALS — BP 130/82 | HR 92 | Temp 99.0°F | Ht 62.0 in | Wt 147.8 lb

## 2021-11-21 DIAGNOSIS — E039 Hypothyroidism, unspecified: Secondary | ICD-10-CM | POA: Diagnosis not present

## 2021-11-21 DIAGNOSIS — E785 Hyperlipidemia, unspecified: Secondary | ICD-10-CM

## 2021-11-21 DIAGNOSIS — E119 Type 2 diabetes mellitus without complications: Secondary | ICD-10-CM

## 2021-11-21 DIAGNOSIS — J069 Acute upper respiratory infection, unspecified: Secondary | ICD-10-CM

## 2021-11-21 DIAGNOSIS — E1169 Type 2 diabetes mellitus with other specified complication: Secondary | ICD-10-CM

## 2021-11-21 MED ORDER — BENZONATATE 100 MG PO CAPS: 100.0000 mg | ORAL_CAPSULE | Freq: Three times a day (TID) | ORAL | 1 refills | Status: DC | PRN

## 2021-11-21 MED ORDER — AZITHROMYCIN 250 MG PO TABS: ORAL_TABLET | ORAL | 0 refills | Status: AC

## 2021-11-21 NOTE — Patient Instructions (Addendum)
It was a pleasure to see you today.Continue same meds for cholesterol, diabetes and hypothyroidism.  Take Zithromax Z-PAK and Tessalon Perles for acute respiratory infection.  Rest and drink plenty of fluids until respiratory infection clears.  Return in 6 months for health maintenance exam.

## 2021-11-21 NOTE — Progress Notes (Incomplete)
° °  Subjective:    Patient ID: Paige Ali, female    DOB: 1946-10-23, 76 y.o.   MRN: [id number]  HPI    Review of Systems     Objective:   Physical Exam        Assessment & Plan:

## 2021-11-21 NOTE — Progress Notes (Signed)
° °  Subjective:    Patient ID: Paige Ali, female    DOB: 1946/02/03, 76 y.o.   MRN: [id number]  HPI 76 year old Female seen for 63-month recheck.  She has a history of hypothyroidism, impaired glucose tolerance and hyperlipidemia.  Her general health is excellent.  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  Daughter lives in [place] and her son lives here.  She is on thyroid replacement medication and generic Lipitor 20 mg daily.  She is on metformin and glipizide for glucose intolerance.  She had a COVID-vaccine in April.  Pneumococcal vaccines are up-to-date.  Tetanus vaccine will be due this year.  Had influenza vaccine in September 2022.  Her recent hemoglobin A1c is 7.1% and was previously 6.9% in July.  We could increase her glipizide but she wants to work on diet and exercise.  Her triglycerides have increased from 157 in July to 185.  LDL is 102 and total cholesterol 192.  She would like to work on diet and exercise and not increase her Lipitor dose.  She is on levothyroxine 75 mcg daily.  TSH was not checked with this visit.  Liver functions are normal.    Review of Systems see above-no new complaints.  She has been in [place] recently visiting her daughter and may not have been following a strict diet.  Has had recent cough and congestion but no fever.     Objective:   Physical Exam Blood pressure 130/82 pulse 92 temperature 99 degrees pulse oximetry 96% weight 147 pounds height 5 feet 2 inches BMI 27.02  Skin: Warm and dry.  Nodes none.  No carotid bruits.  Chest is clear.  No thyromegaly.  Cardiac exam: Regular rate and rhythm without ectopy.  No lower extremity pitting edema.       Assessment & Plan:  Mixed hyperlipidemia-she does not want to increase dose of lipid medication so we will continue with Crestor generic 10 mg daily and follow-up in 6 months  Type 2 diabetes mellitus currently controlled with metformin 500 mg daily and glipizide 2.5 mg  daily.  Hemoglobin A1c is a bit higher than at last visit at 7.1% but she does not want to increase her medication.  Follow-up in 6 months.   Acute URI: Does not have COVID symptoms.  Has slight cough.  Some respiratory congestion.  Chest exam is clear.  She will be treated with Zithromax Z-PAK 2 tabs day 1 followed by 1 tab days 2 through 5 and Tessalon Perles 100 mg up to 3 times daily as needed for cough.  Rest and drink fluids and call if not better in 7 to 10 days or sooner if worse.  Hypothyroidism-TSH was not checked in conjunction with this visit and will be rechecked in 6 months.  Continue levothyroxine 75 mcg daily

## 2021-11-24 ENCOUNTER — Ambulatory Visit: Payer: Medicare Other | Admitting: Internal Medicine

## 2021-11-25 ENCOUNTER — Other Ambulatory Visit: Payer: Self-pay

## 2021-11-25 ENCOUNTER — Telehealth: Payer: Self-pay | Admitting: Internal Medicine

## 2021-11-25 MED ORDER — HYDROCODONE BIT-HOMATROP MBR 5-1.5 MG/5ML PO SOLN: 5.0000 mL | Freq: Three times a day (TID) | ORAL | 0 refills | Status: DC | PRN

## 2021-11-25 NOTE — Progress Notes (Signed)
[facility] is out of cough syrup please send to [facility].

## 2021-11-25 NOTE — Telephone Encounter (Signed)
Paige Ali [phone]  Paige Ali called to say she finished the antibiotic today, but her cough is not any better and it makes her very tired, she is still trying to work. She wants to know if she can get something stranger like a liquid for the cough. Do you want to do a video visit?  [facility] [phone] - [name], [name] Phone:  [phone]  Fax:  [phone]

## 2021-11-25 NOTE — Telephone Encounter (Signed)
Called patient to let her know some stronger cough medicine was called in and could make her nauseous, and if not better middle of next week to call. She verbalized understanding.

## 2021-11-25 NOTE — Telephone Encounter (Signed)
I have sent in Hydrocodone cough medication. Call if not better next week. The antibiotic stays in the system longer than 5 days.

## 2022-05-19 ENCOUNTER — Other Ambulatory Visit: Payer: Medicare Other

## 2022-05-19 DIAGNOSIS — E785 Hyperlipidemia, unspecified: Secondary | ICD-10-CM

## 2022-05-19 DIAGNOSIS — E039 Hypothyroidism, unspecified: Secondary | ICD-10-CM | POA: Diagnosis not present

## 2022-05-19 DIAGNOSIS — E119 Type 2 diabetes mellitus without complications: Secondary | ICD-10-CM | POA: Diagnosis not present

## 2022-05-20 LAB — COMPLETE METABOLIC PANEL WITH GFR
AG Ratio: 1.8 (calc) (ref 1.0–2.5)
ALT: 7 U/L (ref 6–29)
AST: 17 U/L (ref 10–35)
Albumin: 4.9 g/dL (ref 3.6–5.1)
Alkaline phosphatase (APISO): 80 U/L (ref 37–153)
BUN: 18 mg/dL (ref 7–25)
CO2: 26 mmol/L (ref 20–32)
Calcium: 9.7 mg/dL (ref 8.6–10.4)
Chloride: 103 mmol/L (ref 98–110)
Creat: 0.73 mg/dL (ref 0.60–1.00)
Globulin: 2.7 g/dL (calc) (ref 1.9–3.7)
Glucose, Bld: 128 mg/dL — ABNORMAL HIGH (ref 65–99)
Potassium: 4.5 mmol/L (ref 3.5–5.3)
Sodium: 139 mmol/L (ref 135–146)
Total Bilirubin: 1 mg/dL (ref 0.2–1.2)
Total Protein: 7.6 g/dL (ref 6.1–8.1)
eGFR: 86 mL/min/{1.73_m2} (ref >=60)

## 2022-05-20 LAB — CBC WITH DIFFERENTIAL/PLATELET
Absolute Monocytes: 439 cells/uL (ref 200–950)
Basophils Absolute: 29 cells/uL (ref 0–200)
Basophils Relative: 0.5 %
Eosinophils Absolute: 148 cells/uL (ref 15–500)
Eosinophils Relative: 2.6 %
HCT: 38.9 % (ref 35.0–45.0)
Hemoglobin: 12.9 g/dL (ref 11.7–15.5)
Lymphs Abs: 2400 cells/uL (ref 850–3900)
MCH: 31.2 pg (ref 27.0–33.0)
MCHC: 33.2 g/dL (ref 32.0–36.0)
MCV: 94 fL (ref 80.0–100.0)
MPV: 9.9 fL (ref 7.5–12.5)
Monocytes Relative: 7.7 %
Neutro Abs: 2685 cells/uL (ref 1500–7800)
Neutrophils Relative %: 47.1 %
Platelets: 273 10*3/uL (ref 140–400)
RBC: 4.14 10*6/uL (ref 3.80–5.10)
RDW: 12.3 % (ref 11.0–15.0)
Total Lymphocyte: 42.1 %
WBC: 5.7 10*3/uL (ref 3.8–10.8)

## 2022-05-20 LAB — LIPID PANEL
Cholesterol: 185 mg/dL (ref <200)
HDL: 56 mg/dL (ref >=50)
LDL Cholesterol (Calc): 99 mg/dL (calc)
Non-HDL Cholesterol (Calc): 129 mg/dL (calc) (ref <130)
Total CHOL/HDL Ratio: 3.3 (calc) (ref <5.0)
Triglycerides: 210 mg/dL — ABNORMAL HIGH (ref <150)

## 2022-05-20 LAB — HEMOGLOBIN A1C
Hgb A1c MFr Bld: 6.8 % of total Hgb — ABNORMAL HIGH (ref <5.7)
Mean Plasma Glucose: 148 mg/dL
eAG (mmol/L): 8.2 mmol/L

## 2022-05-20 LAB — MICROALBUMIN, URINE: Microalb, Ur: 1.1 mg/dL

## 2022-05-20 LAB — TSH: TSH: 1.73 mIU/L (ref 0.40–4.50)

## 2022-05-22 ENCOUNTER — Ambulatory Visit (INDEPENDENT_AMBULATORY_CARE_PROVIDER_SITE_OTHER): Payer: Medicare Other | Admitting: Internal Medicine

## 2022-05-22 ENCOUNTER — Encounter: Payer: Self-pay | Admitting: Internal Medicine

## 2022-05-22 VITALS — BP 128/72 | HR 89 | Temp 97.8°F | Ht 62.0 in | Wt 147.5 lb

## 2022-05-22 DIAGNOSIS — R319 Hematuria, unspecified: Secondary | ICD-10-CM | POA: Diagnosis not present

## 2022-05-22 DIAGNOSIS — E785 Hyperlipidemia, unspecified: Secondary | ICD-10-CM

## 2022-05-22 DIAGNOSIS — E119 Type 2 diabetes mellitus without complications: Secondary | ICD-10-CM

## 2022-05-22 DIAGNOSIS — E039 Hypothyroidism, unspecified: Secondary | ICD-10-CM | POA: Diagnosis not present

## 2022-05-22 DIAGNOSIS — E1169 Type 2 diabetes mellitus with other specified complication: Secondary | ICD-10-CM

## 2022-05-22 DIAGNOSIS — Z0000 Encounter for general adult medical examination without abnormal findings: Secondary | ICD-10-CM

## 2022-05-22 LAB — POCT URINALYSIS DIPSTICK
Bilirubin, UA: NEGATIVE
Glucose, UA: NEGATIVE
Ketones, UA: NEGATIVE
Leukocytes, UA: NEGATIVE
Nitrite, UA: NEGATIVE
Protein, UA: NEGATIVE
Spec Grav, UA: 1.02 (ref 1.010–1.025)
Urobilinogen, UA: 0.2 E.U./dL
pH, UA: 5.5 (ref 5.0–8.0)

## 2022-05-22 NOTE — Progress Notes (Signed)
Annual Wellness Visit     Patient: Paige Ali, Female    DOB: 02-26-1946, 76 y.o.   MRN: [id number] Visit Date: 05/22/2022  Chief Complaint  Patient presents with   Medicare Wellness   Subjective    Paige Ali is a 76 y.o. female who presents today for her Annual Wellness Visit.  HPI She is also here for health maintenance exam and evaluation of medical issues.  She recently travel to [place] to visit relatives.  She had a good time.  History of hypothyroidism, impaired glucose tolerance and hyperlipidemia.  Her general health is excellent.  History of right shoulder arthropathy and has seen orthopedist in the past.  She tries to exercise regularly with walking and riding her bicycle.  She swims sometimes.  Old records indicate she became glucose intolerant and around 2006.  Social history: She is a native of [place].  She operates a coffee shop.  Town.  Daughter lives in [place].  Son lives here.  Son operates a coffee company.  Patient had colonoscopy in 2020.  Social history: She has been in the [place] for some 20 years.  She has a college degree.  She is a widow.  She does not smoke.  Social alcohol consumption.  Family history: Her father passed away at nearly 89 years of age due to complications of a leg infection.  He lived in [place].  Her mother is still living with history of diabetes.  2 brothers and 2 sisters are healthy.  She had mammogram done in September 2022 at [facility] which was normal.  Her hemoglobin A1c is 6.8% and was 7.1% in January 2023.  She takes glipizide and metformin.  Her triglycerides are elevated at 210 and were 185 in January 2023.  Perhaps she had some dietary indiscretion during her vacation.  LDL cholesterol is excellent at 99.  Total cholesterol is excellent at 185.  She is on atorvastatin 20 mg daily.  I do not think we will increase the dose at this point.  TSH is stable at 1.73 on levothyroxine 75 mcg  daily.      Patient Care Team: Elby Showers, MD as PCP - General (Internal Medicine)  Review of Systems no new complaints-she feels well   Objective    Vitals: Blood pressure 128/72 pulse 89 temperature 97.8 degrees pulse oximetry 98% weight 147 pounds 8 ounces height 5 feet 2 inches BMI 26.98  Physical Exam Skin: Warm and dry.  No cervical adenopathy.  No thyromegaly.  No carotid bruits.  TMs are clear.  Pharynx is clear.  Neck is supple.  Chest clear to auscultation without rales or wheezing.  Cardiac exam: Regular rate and rhythm, normal S1 and S2 without murmurs gallops or rubs.  Abdomen soft nondistended without hepatosplenomegaly masses or tenderness.  Pap deferred due to age.  No lower extremity pitting edema.  Neurological exam is intact without gross focal deficits.  Most recent functional status assessment:    05/22/2022   11:32 AM  In your present state of health, do you have any difficulty performing the following activities:  Hearing? 0  Vision? 0  Difficulty concentrating or making decisions? 0  Walking or climbing stairs? 0  Dressing or bathing? 0  Doing errands, shopping? 0  Preparing Food and eating ? N  Using the Toilet? N  In the past six months, have you accidently leaked urine? N  Do you have problems with loss of bowel control? N  Managing  your Medications? N  Managing your Finances? N  Housekeeping or managing your Housekeeping? N   Most recent fall risk assessment:    05/22/2022   11:32 AM  Fall Risk   Falls in the past year? 0  Number falls in past yr: 0  Injury with Fall? 0    Most recent depression screenings:    05/22/2022   11:32 AM 05/19/2021   10:11 AM  PHQ 2/9 Scores  PHQ - 2 Score 0 0   Most recent cognitive screening:     No data to display             Assessment & Plan   Hypothyroidism stable on current dose of thyroid replacement medication-levothyroxine 75 mcg daily  Impaired glucose tolerance-stable with metformin  and glipizide  Hyperlipidemia-triglycerides are elevated at 210 but she has just returned from vacation in [place].  I will not increase atorvastatin 20 mg daily.  We will follow-up in 6 months.  She will try to exercise some here.  She is due for tetanus immunization update.  Last one was in 2013.  Discussed pneumococcal 20 vaccine.  Will need COVID booster in the fall as well as flu vaccine.  Plan: Continue current medications.  Continue exercise regimen here and watch diet bit.  Follow-up in 6 months.  She needs to stay well-hydrated as she has moderate uric acid crystals and moderate calcium oxalate crystals on urine dipstick.  She has no hematuria or leukocytes in her urine.  No growth on urine culture. Urine microalbumin is 1.1       Annual wellness visit done today including the all of the following: Reviewed patient's Family Medical History Reviewed and updated list of patient's medical providers Assessment of cognitive impairment was done Assessed patient's functional ability Established a written schedule for health screening [place] Completed and Reviewed  Discussed health benefits of physical activity, and encouraged her to engage in regular exercise appropriate for her age and condition.        IElby Showers, MD, have reviewed all documentation for this visit. The documentation on 05/27/22 for the exam, diagnosis, procedures, and orders are all accurate and complete.  Angus Seller, CMA

## 2022-05-23 ENCOUNTER — Ambulatory Visit: Payer: Medicare Other | Admitting: Dermatology

## 2022-05-23 DIAGNOSIS — Z1283 Encounter for screening for malignant neoplasm of skin: Secondary | ICD-10-CM

## 2022-05-23 DIAGNOSIS — L659 Nonscarring hair loss, unspecified: Secondary | ICD-10-CM

## 2022-05-23 LAB — URINE CULTURE
MICRO NUMBER:: 13685303
Result:: NO GROWTH
SPECIMEN QUALITY:: ADEQUATE

## 2022-05-23 LAB — URINALYSIS, MICROSCOPIC ONLY
Bacteria, UA: NONE SEEN /HPF
Hyaline Cast: NONE SEEN /LPF

## 2022-05-23 MED ORDER — CLOBETASOL PROPIONATE 0.05 % EX SOLN: CUTANEOUS | 1 refills | Status: AC

## 2022-05-27 NOTE — Patient Instructions (Addendum)
It was a pleasure to see you today.  Continue current medications.  Continue exercise regimen and watch diet a bit.  Follow-up in 6 months.  Vaccines discussed and are available at pharmacy.

## 2022-06-14 ENCOUNTER — Encounter: Payer: Self-pay | Admitting: Dermatology

## 2022-06-14 NOTE — Progress Notes (Signed)
   Follow-Up Visit   Subjective  Paige Ali is a 76 y.o. female who presents for the following: Skin Problem (Here for full body skin exam. No concerns.).  Skin examination, scalp problem Location:  Duration:  Quality:  Associated Signs/Symptoms: Modifying Factors:  Severity:  Timing: Context:   Objective  Well appearing patient in no apparent distress; mood and affect are within normal limits. Many kerstoses on the legs back. No atypical nevi (all checked with dermoscopy) or signs of NMSC noted at the time of the visit.   Scalp Patch frontal hair loss x years, no obvious fibrosis or follicular dropout.  Widening of central part.  Suspect this is relatively stable alopecia areata rather than frontal fibrosing alopecia..  We discussed obtaining biopsies (no procedure scheduled) or one-time referral to hair loss clinic at a [facility] (not scheduled).    All sun exposed areas plus back examined.  Including face and scalp..   Assessment & Plan    Screening exam for skin cancer  Yearly skin checks   Nonscarring hair loss Scalp  Patch of hair loss no change in size per patient. Spoke about opezlura will initially try clobetasol foam daily for 6 weeks; follow-up by MyChart at that time.  clobetasol (TEMOVATE) 0.05 % external solution - Scalp Apply to scalp nightly 30 days per bottle      I, Lavonna Monarch, MD, have reviewed all documentation for this visit.  The documentation on 06/14/22 for the exam, diagnosis, procedures, and orders are all accurate and complete.

## 2022-06-26 DIAGNOSIS — M25512 Pain in left shoulder: Secondary | ICD-10-CM | POA: Diagnosis not present

## 2022-06-26 DIAGNOSIS — M542 Cervicalgia: Secondary | ICD-10-CM | POA: Diagnosis not present

## 2022-06-28 DIAGNOSIS — S46012D Strain of muscle(s) and tendon(s) of the rotator cuff of left shoulder, subsequent encounter: Secondary | ICD-10-CM | POA: Diagnosis not present

## 2022-07-15 ENCOUNTER — Other Ambulatory Visit: Payer: Self-pay | Admitting: Internal Medicine

## 2022-07-17 DIAGNOSIS — S46012D Strain of muscle(s) and tendon(s) of the rotator cuff of left shoulder, subsequent encounter: Secondary | ICD-10-CM | POA: Diagnosis not present

## 2022-07-24 DIAGNOSIS — S46012D Strain of muscle(s) and tendon(s) of the rotator cuff of left shoulder, subsequent encounter: Secondary | ICD-10-CM | POA: Diagnosis not present

## 2022-08-01 DIAGNOSIS — S46012D Strain of muscle(s) and tendon(s) of the rotator cuff of left shoulder, subsequent encounter: Secondary | ICD-10-CM | POA: Diagnosis not present

## 2022-08-04 DIAGNOSIS — H524 Presbyopia: Secondary | ICD-10-CM | POA: Diagnosis not present

## 2022-08-11 DIAGNOSIS — Z0389 Encounter for observation for other suspected diseases and conditions ruled out: Secondary | ICD-10-CM | POA: Diagnosis not present

## 2022-08-11 DIAGNOSIS — Z1231 Encounter for screening mammogram for malignant neoplasm of breast: Secondary | ICD-10-CM | POA: Diagnosis not present

## 2022-08-16 ENCOUNTER — Other Ambulatory Visit: Payer: Self-pay | Admitting: Internal Medicine

## 2022-08-18 ENCOUNTER — Other Ambulatory Visit: Payer: Self-pay | Admitting: Internal Medicine

## 2022-08-18 ENCOUNTER — Other Ambulatory Visit: Payer: Self-pay

## 2022-08-18 MED ORDER — ONETOUCH ULTRA VI STRP: ORAL_STRIP | 12 refills | Status: DC

## 2022-08-28 ENCOUNTER — Ambulatory Visit (INDEPENDENT_AMBULATORY_CARE_PROVIDER_SITE_OTHER): Payer: Medicare Other | Admitting: Internal Medicine

## 2022-08-28 ENCOUNTER — Telehealth: Payer: Self-pay | Admitting: Internal Medicine

## 2022-08-28 ENCOUNTER — Telehealth: Payer: Self-pay

## 2022-08-28 ENCOUNTER — Encounter: Payer: Self-pay | Admitting: Internal Medicine

## 2022-08-28 VITALS — BP 144/80 | HR 105 | Temp 99.0°F

## 2022-08-28 DIAGNOSIS — J069 Acute upper respiratory infection, unspecified: Secondary | ICD-10-CM

## 2022-08-28 DIAGNOSIS — H6503 Acute serous otitis media, bilateral: Secondary | ICD-10-CM

## 2022-08-28 DIAGNOSIS — R059 Cough, unspecified: Secondary | ICD-10-CM | POA: Diagnosis not present

## 2022-08-28 MED ORDER — BENZONATATE 100 MG PO CAPS: 100.0000 mg | ORAL_CAPSULE | Freq: Three times a day (TID) | ORAL | 0 refills | Status: DC | PRN

## 2022-08-28 MED ORDER — AZITHROMYCIN 250 MG PO TABS: ORAL_TABLET | ORAL | 0 refills | Status: AC

## 2022-08-28 MED ORDER — HYDROCODONE BIT-HOMATROP MBR 5-1.5 MG/5ML PO SOLN: 5.0000 mL | Freq: Three times a day (TID) | ORAL | 0 refills | Status: DC | PRN

## 2022-08-28 MED ORDER — METHYLPREDNISOLONE ACETATE 80 MG/ML IJ SUSP
80.0000 mg | Freq: Once | INTRAMUSCULAR | Status: AC
  Administered 2022-08-28: 80 mg via INTRAMUSCULAR

## 2022-08-28 NOTE — Patient Instructions (Signed)
It was a pleasure to see you today.  You have been given an injection of methylprednisolone IM for congestion.  Please take Zithromax Z-PAK 2 tabs day 1 followed by 1 tab days 2 through 5.  Tessalon Perles 100 mg to take by mouth up to 3 times daily as needed for cough.  Rest and stay well-hydrated.

## 2022-08-28 NOTE — Telephone Encounter (Signed)
Paige Ali [phone]  [name] called to say she has had a cough for 2 weeks and now has yellowish mucus, some sneezing. Scheduled at 2:30 ask to wear mask.

## 2022-08-28 NOTE — Progress Notes (Signed)
   Subjective:    Patient ID: Paige Ali, female    DOB: [birth year], 76 y.o.   MRN: [id number]  HPI 76 year old Female seen today for respiratory congestion ongoing for several days.  Patient has been coughing some for 2 weeks.  Has discolored sputum production and has had some sneezing and upper respiratory symptoms as well.  I records indicate she has not had a flu vaccine this season nor COVID booster.  Patient requests Depo-Medrol injection for congestion relief.  This usually works well for her.  She has a history of glucose intolerance, hypothyroidism and hyperlipidemia.  Her general health is excellent.  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  She is a native of [place].  Her son lives here and her daughter lives in [place].  Review of Systems she denies fever, chills, nausea, vomiting, myalgias     Objective:   Physical Exam Blood pressure 140/84, pulse 105 regular ,temperature 99 degrees by tympanic thermometer, pulse oximetry 98% Skin: Warm and dry.  No cervical adenopathy.  TMs are full bilaterally.  Pharynx is slightly injected without exudate.  Neck supple.  Chest clear to auscultation without rales or wheezing.      Assessment & Plan:  Acute bilateral otitis media (serous)  Acute pharyngitis  Cough  Plan: Depo-Medrol 80 mg IM.  Zithromax Z-PAK 2 tabs day 1 followed by 1 tab days 2 through 5.  Tessalon Perles 100 mg 3 times daily as needed for cough.  Rest and drink plenty of fluids.

## 2022-08-28 NOTE — Telephone Encounter (Signed)
Patient called stating she had cough for more than 2 weeks and would like to know what she should do or take please advise

## 2022-08-29 ENCOUNTER — Other Ambulatory Visit: Payer: Self-pay

## 2022-09-04 ENCOUNTER — Telehealth: Payer: Self-pay | Admitting: Internal Medicine

## 2022-09-04 NOTE — Telephone Encounter (Signed)
error 

## 2022-10-02 MED ORDER — ONETOUCH ULTRA VI STRP: ORAL_STRIP | 12 refills | Status: DC

## 2022-10-02 NOTE — Addendum Note (Signed)
Addended by: Geradine Girt D on: 10/02/2022 10:01 AM   Modules accepted: Orders

## 2023-02-20 DIAGNOSIS — K080 Exfoliation of teeth due to systemic causes: Secondary | ICD-10-CM | POA: Diagnosis not present

## 2023-06-05 NOTE — Progress Notes (Signed)
Annual Wellness Visit    Patient Care Team: Ettel Albergo, Luanna Cole, MD as PCP - General (Internal Medicine) Janalyn Harder, MD (Inactive) as Consulting Physician (Dermatology)  Visit Date: 06/18/23   Chief Complaint  Patient presents with   Medicare Wellness   Annual Exam    Subjective:   Patient: Paige Ali, Female    DOB: [birth year], 77 y.o.   MRN: [id number]  Paige Ali is a 77 y.o. Female who presents today for her Annual Wellness Visit. History of Type 2 diabetes mellitus, hyperlipidemia, hypothyroidism.  She has been having pain in her ankles and takes Tylenol.   History of Type 2 diabetes mellitus treated with glipizide 2.5 mg daily with breakfast, metformin 500 mg twice daily with meals. HGBA1c at 7.4% on 06/15/23, up from 6.8% on 05/19/22. She has been eating a large amount of fruit.  History of hyperlipidemia treated with atorvastatin 20 mg daily. TRIG elevated at 176 on 06/15/23, down from 210 on 05/19/22.  History of hypothyroidism treated with levothyroxine 75 mcg daily. TSH at 0.79.  Her general health is excellent.  History of right shoulder arthropathy and has seen orthopedist in the past.  Old records indicate she became glucose intolerant around 2006.  Glucose elevated at 138. Kidney, liver functions normal. Electrolytes normal. Blood proteins normal. CBC normal.   Mammogram last completed 08/11/22. No mammographic evidence of malignancy. Recommended repeat in 2024.  Patient had colonoscopy in 2020 with tubular adenomas.  Social history: She is a native of [place]. She has been in the [place] for some 20 years. She has a college degree. She is a widow. She does not smoke. Social alcohol consumption. 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 Daughter lives in [place]. Son lives here. Son operates a coffee company.  Family history: Her father passed away at nearly 56 years of age due to complications of a leg infection. He lived in [place]. Her mother is  still living with history of diabetes. 2 brothers and 2 sisters are healthy.   Past Medical History:  Diagnosis Date   Diabetes mellitus    Hyperlipidemia    Thyroid disease      Family History  Problem Relation Age of Onset   Diabetes Mother    Esophageal cancer Neg Hx    Rectal cancer Neg Hx    Stomach cancer Neg Hx    Colon cancer Neg Hx      Social Hx: native of [place]. 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 Daughter lives in [place]. Nonsmoker. Exercises regularly. Rides her bicycle near her home in a safe area.    Review of Systems  Constitutional:  Negative for chills, fever, malaise/fatigue and weight loss.  HENT:  Negative for hearing loss, sinus pain and sore throat.   Respiratory:  Negative for cough, hemoptysis and shortness of breath.   Cardiovascular:  Negative for chest pain, palpitations, leg swelling and PND.  Gastrointestinal:  Negative for abdominal pain, constipation, diarrhea, heartburn, nausea and vomiting.  Genitourinary:  Negative for dysuria, frequency and urgency.  Musculoskeletal:  Negative for back pain, myalgias and neck pain.  Skin:  Negative for itching and rash.  Neurological:  Negative for dizziness, tingling, seizures and headaches.  Endo/Heme/Allergies:  Negative for polydipsia.  Psychiatric/Behavioral:  Negative for depression. The patient is not nervous/anxious.       Objective:   Vitals: BP 130/80   Pulse 89   Ht 5\' 2"  (  1.575 m)   Wt 147 lb (66.7 kg)   SpO2 98%   BMI 26.89 kg/m   Physical Exam Vitals and nursing note reviewed.  Constitutional:      General: She is not in acute distress.    Appearance: Normal appearance. She is not ill-appearing or toxic-appearing.  HENT:     Head: Normocephalic and atraumatic.     Right Ear: Hearing, tympanic membrane, ear canal and external ear normal.     Left Ear: Hearing, tympanic membrane, ear canal and external ear normal.      Mouth/Throat:     Pharynx: Oropharynx is clear.  Eyes:     Extraocular Movements: Extraocular movements intact.     Pupils: Pupils are equal, round, and reactive to light.  Neck:     Thyroid: No thyroid mass, thyromegaly or thyroid tenderness.     Vascular: No carotid bruit.  Cardiovascular:     Rate and Rhythm: Normal rate and regular rhythm. No extrasystoles are present.    Pulses:          Dorsalis pedis pulses are 2+ on the right side and 2+ on the left side.       Posterior tibial pulses are 1+ on the right side and 1+ on the left side.     Heart sounds: Normal heart sounds. No murmur heard.    No friction rub. No gallop.  Pulmonary:     Effort: Pulmonary effort is normal.     Breath sounds: Normal breath sounds. No decreased breath sounds, wheezing, rhonchi or rales.  Chest:     Chest wall: No mass.  Abdominal:     Palpations: Abdomen is soft. There is no hepatomegaly, splenomegaly or mass.     Tenderness: There is no abdominal tenderness.     Hernia: No hernia is present.  Musculoskeletal:     Cervical back: Normal range of motion.     Right lower leg: No edema.     Left lower leg: No edema.  Lymphadenopathy:     Cervical: No cervical adenopathy.     Upper Body:     Right upper body: No supraclavicular adenopathy.     Left upper body: No supraclavicular adenopathy.  Skin:    General: Skin is warm and dry.  Neurological:     General: No focal deficit present.     Mental Status: She is alert and oriented to person, place, and time. Mental status is at baseline.     Sensory: Sensation is intact.     Motor: Motor function is intact. No weakness.     Deep Tendon Reflexes: Reflexes are normal and symmetric.  Psychiatric:        Attention and Perception: Attention normal.        Mood and Affect: Mood normal.        Speech: Speech normal.        Behavior: Behavior normal.        Thought Content: Thought content normal.        Cognition and Memory: Cognition normal.         Judgment: Judgment normal.      Most recent functional status assessment:    06/18/2023    3:16 PM  In your present state of health, do you have any difficulty performing the following activities:  Hearing? 0  Vision? 0  Difficulty concentrating or making decisions? 0  Walking or climbing stairs? 0  Dressing or bathing? 0  Doing errands, shopping? 0  Preparing Food and eating ? N  Using the Toilet? N  In the past six months, have you accidently leaked urine? N  Do you have problems with loss of bowel control? N  Managing your Medications? N  Managing your Finances? N  Housekeeping or managing your Housekeeping? N   Most recent fall risk assessment:    06/18/2023    3:22 PM  Fall Risk   Falls in the past year? 0  Number falls in past yr: 0  Injury with Fall? 0  Risk for fall due to : No Fall Risks    Most recent depression screenings:    06/18/2023    3:16 PM 06/18/2023    3:15 PM  PHQ 2/9 Scores  PHQ - 2 Score 0 0  Exception Documentation  Patient refusal   Most recent cognitive screening:    06/18/2023    3:19 PM  6CIT Screen  What Year? 0 points  What month? 0 points  What time? 0 points  Count back from 20 0 points  Months in reverse 0 points  Repeat phrase 0 points  Total Score 0 points     Results:   Studies obtained and personally reviewed by me:  Mammogram last completed 08/11/22. No mammographic evidence of malignancy. Recommended repeat in 2024.  Patient had colonoscopy in 2020 with tubular adenomas.  T-score 0.3 at [facility] on 08/11/22.  Labs:       Component Value Date/Time   NA 139 06/15/2023 0949   K 4.6 06/15/2023 0949   CL 104 06/15/2023 0949   CO2 26 06/15/2023 0949   GLUCOSE 138 (H) 06/15/2023 0949   BUN 19 06/15/2023 0949   CREATININE 0.79 06/15/2023 0949   CALCIUM 9.7 06/15/2023 0949   PROT 7.5 06/15/2023 0949   ALBUMIN 4.3 04/10/2017 1007   AST 17 06/15/2023 0949   ALT 6 06/15/2023 0949   ALKPHOS 55 04/10/2017 1007    BILITOT 1.1 06/15/2023 0949   GFRNONAA 81 05/14/2020 1142   GFRAA 94 05/14/2020 1142     Lab Results  Component Value Date   WBC 5.5 06/15/2023   HGB 12.3 06/15/2023   HCT 37.8 06/15/2023   MCV 93.6 06/15/2023   PLT 273 06/15/2023    Lab Results  Component Value Date   CHOL 178 06/15/2023   HDL 54 06/15/2023   LDLCALC 97 06/15/2023   TRIG 176 (H) 06/15/2023   CHOLHDL 3.3 06/15/2023    Lab Results  Component Value Date   HGBA1C 7.4 (H) 06/15/2023     Lab Results  Component Value Date   TSH 0.79 06/15/2023    Assessment & Plan:   Type 2 diabetes mellitus: treated with glipizide 2.5 mg daily with breakfast, metformin 500 mg twice daily with meals. HGBA1c at 7.4% on 06/15/23, up from 6.8% on 05/19/22. She has been eating a large amount of fruit. Refilled blood glucose test strips.Decrease carbohydrate consumption. Follow up in 6 months.  Hyperlipidemia: treated with atorvastatin 20 mg daily. TRIG elevated at 176 on 06/15/23, down from 210 on 05/19/22. She will work on controlling with healthy diet and exercise.  Hypothyroidism: treated with levothyroxine 75 mcg daily. TSH stable at 0.79.  Pelvic exam deferred.  Mammogram last completed 08/11/22. No mammographic evidence of malignancy. Recommended repeat in 2024.  Patient had colonoscopy in 2020 with tubular adenomas.Due for follow up 2023. Cologard is an option.   Bone density: T-score 0.3 at [facility] on 08/11/22.  Vaccine counseling: she will go  to the pharmacy for tetanus vaccine. Suggested flu, Covid-19 updates in the Fall.  Return in 6 months or as needed.     Annual wellness visit done today including the all of the following: Reviewed patient's Family Medical History Reviewed and updated list of patient's medical providers Assessment of cognitive impairment was done Assessed patient's functional ability Established a written schedule for health screening services Health Risk Assessent Completed and  Reviewed  Discussed health benefits of physical activity, and encouraged her to engage in regular exercise appropriate for her age and condition.        I,Alexander Ruley,acting as a Neurosurgeon for Margaree Mackintosh, MD.,have documented all relevant documentation on the behalf of Margaree Mackintosh, MD,as directed by  Margaree Mackintosh, MD while in the presence of Margaree Mackintosh, MD.   I, Margaree Mackintosh, MD, have reviewed all documentation for this visit. The documentation on 06/19/23 for the exam, diagnosis, procedures, and orders are all accurate and complete.

## 2023-06-06 DIAGNOSIS — K080 Exfoliation of teeth due to systemic causes: Secondary | ICD-10-CM | POA: Diagnosis not present

## 2023-06-15 ENCOUNTER — Other Ambulatory Visit: Payer: Medicare Other

## 2023-06-15 DIAGNOSIS — E1169 Type 2 diabetes mellitus with other specified complication: Secondary | ICD-10-CM | POA: Diagnosis not present

## 2023-06-15 DIAGNOSIS — E785 Hyperlipidemia, unspecified: Secondary | ICD-10-CM | POA: Diagnosis not present

## 2023-06-15 DIAGNOSIS — E039 Hypothyroidism, unspecified: Secondary | ICD-10-CM | POA: Diagnosis not present

## 2023-06-15 DIAGNOSIS — E119 Type 2 diabetes mellitus without complications: Secondary | ICD-10-CM

## 2023-06-15 DIAGNOSIS — Z0000 Encounter for general adult medical examination without abnormal findings: Secondary | ICD-10-CM

## 2023-06-16 LAB — COMPLETE METABOLIC PANEL WITH GFR
AG Ratio: 1.8 (calc) (ref 1.0–2.5)
ALT: 6 U/L (ref 6–29)
AST: 17 U/L (ref 10–35)
Albumin: 4.8 g/dL (ref 3.6–5.1)
Alkaline phosphatase (APISO): 56 U/L (ref 37–153)
BUN: 19 mg/dL (ref 7–25)
CO2: 26 mmol/L (ref 20–32)
Calcium: 9.7 mg/dL (ref 8.6–10.4)
Chloride: 104 mmol/L (ref 98–110)
Creat: 0.79 mg/dL (ref 0.60–1.00)
Globulin: 2.7 g/dL (ref 1.9–3.7)
Glucose, Bld: 138 mg/dL — ABNORMAL HIGH (ref 65–99)
Potassium: 4.6 mmol/L (ref 3.5–5.3)
Sodium: 139 mmol/L (ref 135–146)
Total Bilirubin: 1.1 mg/dL (ref 0.2–1.2)
Total Protein: 7.5 g/dL (ref 6.1–8.1)
eGFR: 78 mL/min/{1.73_m2} (ref >=60)

## 2023-06-16 LAB — LIPID PANEL
Cholesterol: 178 mg/dL (ref <200)
HDL: 54 mg/dL (ref >=50)
LDL Cholesterol (Calc): 97 mg/dL
Non-HDL Cholesterol (Calc): 124 mg/dL (ref <130)
Total CHOL/HDL Ratio: 3.3 (calc) (ref <5.0)
Triglycerides: 176 mg/dL — ABNORMAL HIGH (ref <150)

## 2023-06-16 LAB — MICROALBUMIN / CREATININE URINE RATIO
Creatinine, Urine: 68 mg/dL (ref 20–275)
Microalb Creat Ratio: 4 mg/g{creat} (ref <30)
Microalb, Ur: 0.3 mg/dL

## 2023-06-16 LAB — CBC WITH DIFFERENTIAL/PLATELET
Absolute Monocytes: 429 {cells}/uL (ref 200–950)
Basophils Absolute: 28 {cells}/uL (ref 0–200)
Basophils Relative: 0.5 %
Eosinophils Absolute: 171 {cells}/uL (ref 15–500)
Eosinophils Relative: 3.1 %
HCT: 37.8 % (ref 35.0–45.0)
Hemoglobin: 12.3 g/dL (ref 11.7–15.5)
Lymphs Abs: 2343 {cells}/uL (ref 850–3900)
MCH: 30.4 pg (ref 27.0–33.0)
MCHC: 32.5 g/dL (ref 32.0–36.0)
MCV: 93.6 fL (ref 80.0–100.0)
MPV: 9.9 fL (ref 7.5–12.5)
Monocytes Relative: 7.8 %
Neutro Abs: 2530 {cells}/uL (ref 1500–7800)
Neutrophils Relative %: 46 %
Platelets: 273 10*3/uL (ref 140–400)
RBC: 4.04 10*6/uL (ref 3.80–5.10)
RDW: 12.6 % (ref 11.0–15.0)
Total Lymphocyte: 42.6 %
WBC: 5.5 10*3/uL (ref 3.8–10.8)

## 2023-06-16 LAB — TSH: TSH: 0.79 m[IU]/L (ref 0.40–4.50)

## 2023-06-16 LAB — HEMOGLOBIN A1C
Hgb A1c MFr Bld: 7.4 %{Hb} — ABNORMAL HIGH (ref <5.7)
Mean Plasma Glucose: 166 mg/dL
eAG (mmol/L): 9.2 mmol/L

## 2023-06-18 ENCOUNTER — Ambulatory Visit: Payer: Medicare Other | Admitting: Internal Medicine

## 2023-06-18 ENCOUNTER — Encounter: Payer: Self-pay | Admitting: Internal Medicine

## 2023-06-18 VITALS — BP 130/80 | HR 89 | Ht 62.0 in | Wt 147.0 lb

## 2023-06-18 DIAGNOSIS — E1169 Type 2 diabetes mellitus with other specified complication: Secondary | ICD-10-CM

## 2023-06-18 DIAGNOSIS — E785 Hyperlipidemia, unspecified: Secondary | ICD-10-CM

## 2023-06-18 DIAGNOSIS — Z0000 Encounter for general adult medical examination without abnormal findings: Secondary | ICD-10-CM | POA: Diagnosis not present

## 2023-06-18 DIAGNOSIS — E039 Hypothyroidism, unspecified: Secondary | ICD-10-CM | POA: Diagnosis not present

## 2023-06-18 DIAGNOSIS — E119 Type 2 diabetes mellitus without complications: Secondary | ICD-10-CM | POA: Diagnosis not present

## 2023-06-18 LAB — POCT URINALYSIS DIP (CLINITEK)
Bilirubin, UA: NEGATIVE
Blood, UA: NEGATIVE
Glucose, UA: NEGATIVE mg/dL
Ketones, POC UA: NEGATIVE mg/dL
Leukocytes, UA: NEGATIVE
Nitrite, UA: NEGATIVE
POC PROTEIN,UA: NEGATIVE
Spec Grav, UA: 1.01 (ref 1.010–1.025)
Urobilinogen, UA: 0.2 E.U./dL
pH, UA: 6 (ref 5.0–8.0)

## 2023-06-18 MED ORDER — ONETOUCH ULTRA VI STRP: ORAL_STRIP | 1 refills | Status: DC

## 2023-06-19 NOTE — Patient Instructions (Addendum)
It was a pleasure to see you today.  Continue current medications.  Watch amount of fruit that you consume as your glucose is elevated a bit more than it usually is.  Colonoscopy is due but Cologuard is an option should you desire that.  Mammogram is due in October.  Return in 6 months for follow-up on diabetes.  Continue diet and exercise efforts.

## 2023-07-05 ENCOUNTER — Other Ambulatory Visit: Payer: Self-pay | Admitting: Internal Medicine

## 2023-07-10 ENCOUNTER — Other Ambulatory Visit: Payer: Self-pay

## 2023-07-10 MED ORDER — ATORVASTATIN CALCIUM 20 MG PO TABS: 20.0000 mg | ORAL_TABLET | Freq: Every day | ORAL | 3 refills | Status: DC

## 2023-07-17 DIAGNOSIS — H52223 Regular astigmatism, bilateral: Secondary | ICD-10-CM | POA: Diagnosis not present

## 2023-07-17 LAB — HM DIABETES EYE EXAM

## 2023-07-23 DIAGNOSIS — H524 Presbyopia: Secondary | ICD-10-CM | POA: Diagnosis not present

## 2023-08-06 ENCOUNTER — Other Ambulatory Visit: Payer: Self-pay | Admitting: Internal Medicine

## 2023-08-24 DIAGNOSIS — Z1231 Encounter for screening mammogram for malignant neoplasm of breast: Secondary | ICD-10-CM | POA: Diagnosis not present

## 2023-08-24 LAB — HM MAMMOGRAPHY

## 2023-08-27 ENCOUNTER — Encounter: Payer: Self-pay | Admitting: Internal Medicine

## 2023-10-04 ENCOUNTER — Telehealth: Payer: Self-pay | Admitting: Internal Medicine

## 2023-10-04 NOTE — Telephone Encounter (Signed)
Copied from CRM [id number]. Topic: Clinical - Medical Advice >> Oct 04, 2023  1:15 PM Judeth Cornfield R wrote: PT states she has a runny nose/ cough and is requesting Dr, Lenord Fellers to send in a medication for her- PT declined an appointment- PT is requesting a call back on confirmation of her request.

## 2023-10-04 NOTE — Telephone Encounter (Signed)
Called patient back to let her know she had to have an  appointment and she already had an appointment

## 2023-10-05 ENCOUNTER — Ambulatory Visit (INDEPENDENT_AMBULATORY_CARE_PROVIDER_SITE_OTHER): Payer: Medicare Other | Admitting: Internal Medicine

## 2023-10-05 ENCOUNTER — Encounter: Payer: Self-pay | Admitting: Internal Medicine

## 2023-10-05 VITALS — BP 120/80 | HR 95 | Temp 98.0°F | Ht 62.0 in | Wt 147.0 lb

## 2023-10-05 DIAGNOSIS — J22 Unspecified acute lower respiratory infection: Secondary | ICD-10-CM | POA: Diagnosis not present

## 2023-10-05 MED ORDER — AZITHROMYCIN 250 MG PO TABS: ORAL_TABLET | ORAL | 0 refills | Status: AC

## 2023-10-05 MED ORDER — METHYLPREDNISOLONE ACETATE 80 MG/ML IJ SUSP
80.0000 mg | Freq: Once | INTRAMUSCULAR | Status: AC
Start: 2023-10-05 — End: 2023-10-05
  Administered 2023-10-05: 80 mg via INTRAMUSCULAR

## 2023-10-05 MED ORDER — BENZONATATE 100 MG PO CAPS: 100.0000 mg | ORAL_CAPSULE | Freq: Three times a day (TID) | ORAL | 0 refills | Status: DC | PRN

## 2023-10-05 NOTE — Patient Instructions (Addendum)
Take Zithromax Z Pak 2 tabs day 1 followed by one tab days 2-5. Given Depomedrol 80 mg IM in office for cough and congestion. Take Tessalon perles as needed for cough and congestion.

## 2023-10-05 NOTE — Progress Notes (Addendum)
Patient Care Team: Margaree Mackintosh, MD as PCP - General (Internal Medicine) Janalyn Harder, MD (Inactive) as Consulting Physician (Dermatology)  Visit Date: 10/05/23  Subjective:    Patient ID: Paige Ali , Female   DOB: Feb 28, 1946, 77 y.o.    MRN: [id number]   77 y.o. Female presents today for cough. Recently returned from a trip to [place] for a week.  She says she has Throat discomfort when she coughs.  Past Medical History:  Diagnosis Date   Diabetes mellitus    Hyperlipidemia    Thyroid disease      Family History  Problem Relation Age of Onset   Diabetes Mother    Esophageal cancer Neg Hx    Rectal cancer Neg Hx    Stomach cancer Neg Hx    Colon cancer Neg Hx     Social Hx: Resides alone. 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 Nonsmoker.Likes to travel     Review of Systems  Constitutional:  Negative for fever and malaise/fatigue.  HENT:  Negative for congestion.   Eyes:  Negative for blurred vision.  Respiratory:  Positive for cough. Negative for shortness of breath.   Cardiovascular:  Negative for chest pain, palpitations and leg swelling.  Gastrointestinal:  Negative for vomiting.  Musculoskeletal:  Negative for back pain.  Skin:  Negative for rash.  Neurological:  Negative for loss of consciousness and headaches.        Objective:   Vitals: BP 120/80   Pulse 95   Temp 98 F (36.7 C)   Ht 5\' 2"  (1.575 m)   Wt 147 lb (66.7 kg)   SpO2 97%   BMI 26.89 kg/m    Physical Exam Vitals and nursing note reviewed.  Constitutional:      General: She is not in acute distress.    Appearance: Normal appearance. She is not toxic-appearing.  HENT:     Head: Normocephalic and atraumatic.     Right Ear: Hearing, tympanic membrane, ear canal and external ear normal.     Left Ear: Hearing, tympanic membrane, ear canal and external ear normal.     Mouth/Throat:     Comments: Pharynx slightly injected. Neck:     Comments: Anterior cervical nodes  bilaterally-- tender to palpation. Pulmonary:     Effort: Pulmonary effort is normal. No respiratory distress.     Breath sounds: Normal breath sounds. No wheezing or rales.  Skin:    General: Skin is warm and dry.  Neurological:     Mental Status: She is alert and oriented to person, place, and time. Mental status is at baseline.  Psychiatric:        Mood and Affect: Mood normal.        Behavior: Behavior normal.        Thought Content: Thought content normal.        Judgment: Judgment normal.       Results:   Studies obtained and personally reviewed by me:   Labs:       Component Value Date/Time   NA 139 06/15/2023 0949   K 4.6 06/15/2023 0949   CL 104 06/15/2023 0949   CO2 26 06/15/2023 0949   GLUCOSE 138 (H) 06/15/2023 0949   BUN 19 06/15/2023 0949   CREATININE 0.79 06/15/2023 0949   CALCIUM 9.7 06/15/2023 0949   PROT 7.5 06/15/2023 0949   ALBUMIN 4.3 04/10/2017 1007   AST 17 06/15/2023 0949   ALT 6 06/15/2023 0949   ALKPHOS  55 04/10/2017 1007   BILITOT 1.1 06/15/2023 0949   GFRNONAA 81 05/14/2020 1142   GFRAA 94 05/14/2020 1142     Lab Results  Component Value Date   WBC 5.5 06/15/2023   HGB 12.3 06/15/2023   HCT 37.8 06/15/2023   MCV 93.6 06/15/2023   PLT 273 06/15/2023    Lab Results  Component Value Date   CHOL 178 06/15/2023   HDL 54 06/15/2023   LDLCALC 97 06/15/2023   TRIG 176 (H) 06/15/2023   CHOLHDL 3.3 06/15/2023    Lab Results  Component Value Date   HGBA1C 7.4 (H) 06/15/2023     Lab Results  Component Value Date   TSH 0.79 06/15/2023      Assessment & Plan:   Acute lower respiratory infection: prescribed Zithromax Z Pak:  Take two tabs day 1 followed by one tab days 2-5. Administered Depo-medrol 80 mg IM. Contact us if no improvement in 5 days.Tessalon perles 100 mg 3 times daily as needed for cough.    I,Alexander Ruley,acting as a Neurosurgeon for Margaree Mackintosh, MD.,have documented all relevant documentation on the behalf of  Margaree Mackintosh, MD,as directed by  Margaree Mackintosh, MD while in the presence of Margaree Mackintosh, MD.   I, Margaree Mackintosh, MD, have reviewed all documentation for this visit. The documentation on 10/05/23 for the exam, diagnosis, procedures, and orders are all accurate and complete.

## 2023-10-08 DIAGNOSIS — H40013 Open angle with borderline findings, low risk, bilateral: Secondary | ICD-10-CM | POA: Diagnosis not present

## 2023-10-08 DIAGNOSIS — H04553 Acquired stenosis of bilateral nasolacrimal duct: Secondary | ICD-10-CM | POA: Diagnosis not present

## 2023-12-14 ENCOUNTER — Other Ambulatory Visit: Payer: Medicare Other

## 2023-12-14 DIAGNOSIS — E785 Hyperlipidemia, unspecified: Secondary | ICD-10-CM | POA: Diagnosis not present

## 2023-12-14 DIAGNOSIS — E039 Hypothyroidism, unspecified: Secondary | ICD-10-CM | POA: Diagnosis not present

## 2023-12-14 DIAGNOSIS — E119 Type 2 diabetes mellitus without complications: Secondary | ICD-10-CM | POA: Diagnosis not present

## 2023-12-14 DIAGNOSIS — E1169 Type 2 diabetes mellitus with other specified complication: Secondary | ICD-10-CM

## 2023-12-15 LAB — HEPATIC FUNCTION PANEL
AG Ratio: 1.7 (calc) (ref 1.0–2.5)
ALT: 8 U/L (ref 6–29)
AST: 19 U/L (ref 10–35)
Albumin: 4.9 g/dL (ref 3.6–5.1)
Alkaline phosphatase (APISO): 59 U/L (ref 37–153)
Bilirubin, Direct: 0.1 mg/dL (ref 0.0–0.2)
Globulin: 2.9 g/dL (ref 1.9–3.7)
Indirect Bilirubin: 0.6 mg/dL (ref 0.2–1.2)
Total Bilirubin: 0.7 mg/dL (ref 0.2–1.2)
Total Protein: 7.8 g/dL (ref 6.1–8.1)

## 2023-12-15 LAB — LIPID PANEL
Cholesterol: 212 mg/dL — ABNORMAL HIGH (ref <200)
HDL: 69 mg/dL (ref >=50)
LDL Cholesterol (Calc): 115 mg/dL — ABNORMAL HIGH
Non-HDL Cholesterol (Calc): 143 mg/dL — ABNORMAL HIGH (ref <130)
Total CHOL/HDL Ratio: 3.1 (calc) (ref <5.0)
Triglycerides: 162 mg/dL — ABNORMAL HIGH (ref <150)

## 2023-12-15 LAB — HEMOGLOBIN A1C
Hgb A1c MFr Bld: 8.1 %{Hb} — ABNORMAL HIGH (ref <5.7)
Mean Plasma Glucose: 186 mg/dL
eAG (mmol/L): 10.3 mmol/L

## 2023-12-15 LAB — MICROALBUMIN / CREATININE URINE RATIO
Creatinine, Urine: 26 mg/dL (ref 20–275)
Microalb Creat Ratio: 15 mg/g{creat} (ref <30)
Microalb, Ur: 0.4 mg/dL

## 2023-12-15 LAB — TSH: TSH: 1.61 m[IU]/L (ref 0.40–4.50)

## 2023-12-17 ENCOUNTER — Ambulatory Visit (INDEPENDENT_AMBULATORY_CARE_PROVIDER_SITE_OTHER): Payer: Medicare Other | Admitting: Internal Medicine

## 2023-12-17 ENCOUNTER — Encounter: Payer: Self-pay | Admitting: Internal Medicine

## 2023-12-17 VITALS — BP 100/80 | HR 80 | Ht 62.0 in | Wt 150.0 lb

## 2023-12-17 DIAGNOSIS — M25552 Pain in left hip: Secondary | ICD-10-CM

## 2023-12-17 DIAGNOSIS — E1169 Type 2 diabetes mellitus with other specified complication: Secondary | ICD-10-CM

## 2023-12-17 DIAGNOSIS — E785 Hyperlipidemia, unspecified: Secondary | ICD-10-CM | POA: Diagnosis not present

## 2023-12-17 DIAGNOSIS — E039 Hypothyroidism, unspecified: Secondary | ICD-10-CM | POA: Diagnosis not present

## 2023-12-17 NOTE — Progress Notes (Signed)
 Patient Care Team: Margaree Mackintosh, MD as PCP - General (Internal Medicine) Janalyn Harder, MD (Inactive) as Consulting Physician (Dermatology)  Visit Date: 12/17/23  Subjective:   Chief Complaint  Patient presents with   Medical Management of Chronic Issues   Diabetes   Hyperlipidemia  Patient [id number] Byrd, Terrero DOB:08-09-1946,78 y.o. NGE:[id number]   78 y.o. Female presents today for 6 months follow-up for DM, HLD, and Hypothyroidism. Seen 06/18/2023 for her annual visit, in the interim she has had her mammogram 08/24/2023, which was normal with a repeat recommendation of 2025, and was seen again in this office in December for an acute respiratory infection.   She reports that she has been travelling and celebrating the recent holidays, which may be what has elevated her sugars and lipids this visit.   Also notes she has been having some left hip pain, usually when stepping up. States she's tried heat, which did not help much.   History of Diabetes Mellitus treated with 500 mg Metformin BID w/meals and 2.5 mg Glipizide daily w/breakfast. 12/14/2023 HgbA1c 8.1, elevated from 7.4 on 06/15/2023. She reports that she is UTD on her eye exam, with no changes and no glaucoma   History of Hyperlipidemia treated with 20 mg Atorvastatin daily. 12/14/2023 Lipid Panel, compared to on 06/15/23, with Cholesterol 212, elevated from 178; TRIG 162, decreased from 176 but still elevated; LDL 115, elevated from 97; Non-HDL 143, elevated from 124.    History of Hypothyroidism treated with 75 mcg Levothyroxine daily. 12/14/2023 TSH 1.61, elevated from 0.79 but still wnl.   Reviewed 12/14/2023 Hepatic Function Panel WNL and Microalb/Creat Ratio WNL.   Discussed repeat colonoscopy, as she has been overdue since 2023, which saw the removal of 3 sessile pre-cancerous polyps - one 7 mm in ileocecal valve and two 7-11 mm in ascending colon; small & large-mouthed diverticula found in sigmoid and descending  colon; Non-bleeding small internal hemorrhoids.   Vaccine Counseling: Due for Tdap, but otherwise UTD on all vaccines.  Past Medical History:  Diagnosis Date   Diabetes mellitus    Hyperlipidemia    Thyroid disease     Allergies  Allergen Reactions   Codeine Nausea And Vomiting    Family History  Problem Relation Age of Onset   Diabetes Mother    Esophageal cancer Neg Hx    Rectal cancer Neg Hx    Stomach cancer Neg Hx    Colon cancer Neg Hx    Social History   Social History Narrative   Native of [place], has been in the [place] for ~20 years. She has a college degree. Widowed. Non-smoker. Social alcohol consumption. She operates a coffee shop. Daughter lives in [place]. Son lives here. Son operates a coffee company.   Review of Systems  Constitutional:  Negative for fever and malaise/fatigue.  HENT:  Negative for congestion.   Eyes:  Negative for blurred vision.  Respiratory:  Negative for cough and shortness of breath.   Cardiovascular:  Negative for chest pain, palpitations and leg swelling.  Gastrointestinal:  Negative for vomiting.  Musculoskeletal:  Negative for back pain.       (+) Left Hip pain -   Skin:  Negative for rash.  Neurological:  Negative for loss of consciousness and headaches.     Objective:  Vitals: BP 100/80   Pulse 80   Ht 5\' 2"  (1.575 m)   Wt 150 lb (68 kg)   SpO2 99%   BMI 27.44 kg/m   Physical  Exam Vitals and nursing note reviewed.  Constitutional:      General: She is not in acute distress.    Appearance: Normal appearance. She is not toxic-appearing.  HENT:     Head: Normocephalic and atraumatic.  Pulmonary:     Effort: Pulmonary effort is normal.  Skin:    General: Skin is warm and dry.  Neurological:     Mental Status: She is alert and oriented to person, place, and time. Mental status is at baseline.  Psychiatric:        Mood and Affect: Mood normal.        Behavior: Behavior normal.        Thought Content: Thought  content normal.        Judgment: Judgment normal.     Results:  Studies Obtained And Personally Reviewed By Me:  08/24/2023 Mammogram normal with repeat recommendation of 2025.   Colonoscopy, overdue since 2023, last completed 11/14/18 which saw the removal of 3 sessile pre-cancerous polyps - one 7 mm in ileocecal valve and two 7-11 mm in ascending colon; small & large-mouthed diverticula found in sigmoid and descending colon; Non-bleeding small internal hemorrhoids.   Labs:     Component Value Date/Time   NA 139 06/15/2023 0949   K 4.6 06/15/2023 0949   CL 104 06/15/2023 0949   CO2 26 06/15/2023 0949   GLUCOSE 138 (H) 06/15/2023 0949   BUN 19 06/15/2023 0949   CREATININE 0.79 06/15/2023 0949   CALCIUM 9.7 06/15/2023 0949   PROT 7.8 12/14/2023 1008   ALBUMIN 4.3 04/10/2017 1007   AST 19 12/14/2023 1008   ALT 8 12/14/2023 1008   ALKPHOS 55 04/10/2017 1007   BILITOT 0.7 12/14/2023 1008   GFRNONAA 81 05/14/2020 1142   GFRAA 94 05/14/2020 1142    Lab Results  Component Value Date   WBC 5.5 06/15/2023   HGB 12.3 06/15/2023   HCT 37.8 06/15/2023   MCV 93.6 06/15/2023   PLT 273 06/15/2023   Lab Results  Component Value Date   CHOL 212 (H) 12/14/2023   HDL 69 12/14/2023   LDLCALC 115 (H) 12/14/2023   TRIG 162 (H) 12/14/2023   CHOLHDL 3.1 12/14/2023   Lab Results  Component Value Date   HGBA1C 8.1 (H) 12/14/2023    Lab Results  Component Value Date   TSH 1.61 12/14/2023    Assessment & Plan:  Diabetes Mellitus treated with 500 mg Metformin BID w/meals and 2.5 mg Glipizide daily w/breakfast. 12/14/2023 HgbA1c 8.1, elevated from 7.4 on 06/15/2023. Could watch diet a bit more. Does not want additional medication at this time follow up in 6 months.  Hyperlipidemia treated with 20 mg Atorvastatin daily. 12/14/2023 Lipid Panel, compared to on 06/15/23, with Cholesterol 212, elevated from 178; TRIG 162, decreased from 176 but still elevated; LDL 115, elevated from 97; Non-HDL  143, elevated from 124.    Hypothyroidism treated with 75 mcg Levothyroxine daily. 12/14/2023 TSH 1.61, elevated from 0.79 but still wnl.   Reviewed 12/14/2023 Hepatic Function Panel WNL and Microalb/Creat Ratio WNL.  Left Hip Pain, usually when stepping up. Tried heat, which did not help much. Recommended trying ice and Aleve or Advil.    11/14/2018 Colonoscopy, overdue since 2023, which saw the removal of 3 sessile pre-cancerous polyps - one 7 mm in ileocecal valve and two 7-11 mm in ascending colon; small & large-mouthed diverticula found in sigmoid and descending colon; Non-bleeding small internal hemorrhoids.   Vaccine Counseling: Due for Tdap, but  otherwise UTD on all vaccines.    I,Emily Lagle,acting as a Neurosurgeon for Margaree Mackintosh, MD.,have documented all relevant documentation on the behalf of Margaree Mackintosh, MD,as directed by  Margaree Mackintosh, MD while in the presence of Margaree Mackintosh, MD.   I, Margaree Mackintosh, MD, have reviewed all documentation for this visit. The documentation on 12/27/23 for the exam, diagnosis, procedures, and orders are all accurate and complete.

## 2023-12-27 ENCOUNTER — Encounter: Payer: Self-pay | Admitting: Internal Medicine

## 2023-12-27 NOTE — Patient Instructions (Signed)
 It was a pleasure to see you. As we discussed watch diet a bit more. We will not change your meds at this time. Return in 6 months for wellness exam and labs.

## 2024-01-14 DIAGNOSIS — K080 Exfoliation of teeth due to systemic causes: Secondary | ICD-10-CM | POA: Diagnosis not present

## 2024-02-21 ENCOUNTER — Other Ambulatory Visit: Payer: Medicare Other

## 2024-02-21 DIAGNOSIS — N183 Chronic kidney disease, stage 3 unspecified: Secondary | ICD-10-CM

## 2024-02-21 DIAGNOSIS — E1122 Type 2 diabetes mellitus with diabetic chronic kidney disease: Secondary | ICD-10-CM | POA: Diagnosis not present

## 2024-02-22 LAB — HEMOGLOBIN A1C
Hgb A1c MFr Bld: 7.1 % — ABNORMAL HIGH (ref <5.7)
Mean Plasma Glucose: 157 mg/dL
eAG (mmol/L): 8.7 mmol/L

## 2024-03-10 ENCOUNTER — Telehealth: Payer: Self-pay | Admitting: Internal Medicine

## 2024-03-10 MED ORDER — LEVOTHYROXINE SODIUM 75 MCG PO TABS: 75.0000 ug | ORAL_TABLET | Freq: Every day | ORAL | 0 refills | Status: DC

## 2024-03-10 NOTE — Telephone Encounter (Signed)
 Rx was sent

## 2024-03-10 NOTE — Telephone Encounter (Signed)
 Patient requesting refill on levothyroxine  (SYNTHROID ) 75 MCG tablet to be sent in today to  [id number] - [place], [place] - [address] AT [facility] [address] . Patient said she is going out of town tomorrow and needs to pick it up today. Please advise.

## 2024-04-18 ENCOUNTER — Other Ambulatory Visit: Payer: Self-pay

## 2024-04-18 ENCOUNTER — Other Ambulatory Visit: Payer: Self-pay | Admitting: Internal Medicine

## 2024-04-18 DIAGNOSIS — E119 Type 2 diabetes mellitus without complications: Secondary | ICD-10-CM

## 2024-04-18 MED ORDER — ONETOUCH ULTRA VI STRP
ORAL_STRIP | 1 refills | Status: AC
Start: 2024-04-18 — End: ?

## 2024-04-22 ENCOUNTER — Other Ambulatory Visit: Payer: Self-pay

## 2024-04-22 DIAGNOSIS — E113593 Type 2 diabetes mellitus with proliferative diabetic retinopathy without macular edema, bilateral: Secondary | ICD-10-CM

## 2024-04-22 MED ORDER — GLUCOSE BLOOD VI STRP: ORAL_STRIP | 3 refills | Status: AC

## 2024-04-24 ENCOUNTER — Other Ambulatory Visit: Payer: Self-pay

## 2024-04-24 DIAGNOSIS — E1169 Type 2 diabetes mellitus with other specified complication: Secondary | ICD-10-CM

## 2024-04-24 DIAGNOSIS — N183 Chronic kidney disease, stage 3 unspecified: Secondary | ICD-10-CM

## 2024-04-24 MED ORDER — ATORVASTATIN CALCIUM 20 MG PO TABS: 20.0000 mg | ORAL_TABLET | Freq: Every day | ORAL | 3 refills | Status: AC

## 2024-04-24 MED ORDER — METFORMIN HCL 500 MG PO TABS: ORAL_TABLET | ORAL | 3 refills | Status: AC

## 2024-04-25 ENCOUNTER — Other Ambulatory Visit: Payer: Self-pay | Admitting: Internal Medicine

## 2024-04-25 DIAGNOSIS — M1712 Unilateral primary osteoarthritis, left knee: Secondary | ICD-10-CM | POA: Diagnosis not present

## 2024-04-25 DIAGNOSIS — M545 Low back pain, unspecified: Secondary | ICD-10-CM | POA: Diagnosis not present

## 2024-04-28 ENCOUNTER — Telehealth: Payer: Self-pay

## 2024-04-28 NOTE — Telephone Encounter (Signed)
 Reason for CRM: [id number] - [place], [place] - [address] AT [facility] [id number] [place] [phone], called in stating they need instructions on how many times a day the pt will be testing for the test strips prescription that was sent in for insurance purposes.  Please advise so I could call pharmacy.

## 2024-05-01 ENCOUNTER — Telehealth: Payer: Self-pay | Admitting: Internal Medicine

## 2024-05-01 ENCOUNTER — Other Ambulatory Visit: Payer: Self-pay | Admitting: Internal Medicine

## 2024-05-01 NOTE — Telephone Encounter (Signed)
 Copied from CRM [id number]. Topic: Clinical - Medication Refill >> May 01, 2024  1:53 PM Zebedee SAUNDERS wrote: Medication: levothyroxine  (SYNTHROID ) 75 MCG tablet  Has the patient contacted their pharmacy? Yes (Agent: If no, request that the patient contact the pharmacy for the refill. If patient does not wish to contact the pharmacy document the reason why and proceed with request.) (Agent: If yes, when and what did the pharmacy advise?)Pharmacy need PCP approval.  This is the patient's preferred pharmacy:  [id number] [address], [place] - [address] AT [facility] [address] [place] [address] Phone: [phone] Fax: [phone]  Is this the correct pharmacy for this prescription? Yes If no, delete pharmacy and type the correct one.   Has the prescription been filled recently? Yes  Is the patient out of the medication? Yes  Has the patient been seen for an appointment in the last year OR does the patient have an upcoming appointment? Yes  Can we respond through MyChart? No  Agent: Please be advised that Rx refills may take up to 3 business days. We ask that you follow-up with your pharmacy.

## 2024-05-07 MED ORDER — LEVOTHYROXINE SODIUM 75 MCG PO TABS
75.0000 ug | ORAL_TABLET | Freq: Every day | ORAL | 0 refills | Status: AC
Start: 2024-05-07 — End: ?

## 2024-06-06 ENCOUNTER — Other Ambulatory Visit: Payer: Self-pay | Admitting: Internal Medicine

## 2024-06-06 NOTE — Telephone Encounter (Signed)
 Copied from CRM #[id number]. Topic: Clinical - Medication Refill >> Jun 06, 2024  4:58 PM Zebedee SAUNDERS wrote: Medication: glipiZIDE  (GLUCOTROL  XL) 2.5 MG 24 hr tablet  Has the patient contacted their pharmacy? Yes (Agent: If no, request that the patient contact the pharmacy for the refill. If patient does not wish to contact the pharmacy document the reason why and proceed with request.) (Agent: If yes, when and what did the pharmacy advise?)  This is the patient's preferred pharmacy:   [facility] fax: [phone] ph: [phone]   Is this the correct pharmacy for this prescription? Yes If no, delete pharmacy and type the correct one.   Has the prescription been filled recently? No  Is the patient out of the medication? Yes  Has the patient been seen for an appointment in the last year OR does the patient have an upcoming appointment? Yes  Can we respond through MyChart? Yes  Agent: Please be advised that Rx refills may take up to 3 business days. We ask that you follow-up with your pharmacy.

## 2024-06-09 MED ORDER — GLIPIZIDE ER 2.5 MG PO TB24: ORAL_TABLET | ORAL | 3 refills | Status: DC

## 2024-06-12 ENCOUNTER — Other Ambulatory Visit: Payer: Self-pay | Admitting: Internal Medicine

## 2024-06-12 MED ORDER — GLIPIZIDE ER 2.5 MG PO TB24: ORAL_TABLET | ORAL | 3 refills | Status: AC

## 2024-06-12 NOTE — Telephone Encounter (Unsigned)
 Copied from CRM [id number]. Topic: Clinical - Medication Refill >> Jun 12, 2024 11:08 AM Dedra B wrote: Medication: glipiZIDE  (GLUCOTROL  XL) 2.5 MG 24 hr table  Has the patient contacted their pharmacy? Yes.  (Agent: If yes, when and what did the pharmacy advise?) [facility]CON[facility] saying that the patient leaves tomorrow for vacation and even if they expedite her mail order it still will be 3 days before she receives it. Would like the Rx sent to [facility] listed instead.  This is the patient's preferred pharmacy:  [facility] #[id number] [address],  - [address] AT [facility] [address] [place] [address] Phone: [phone] Fax: [phone]  Is this the correct pharmacy for this prescription? Yes If no, delete pharmacy and type the correct one.   Has the prescription been filled recently? No  Is the patient out of the medication? Yes  Has the patient been seen for an appointment in the last year OR does the patient have an upcoming appointment? Yes  Can we respond through MyChart? Yes  Agent: Please be advised that Rx refills may take up to 3 business days. We ask that you follow-up with your pharmacy.

## 2024-06-12 NOTE — Telephone Encounter (Signed)
 Copied from CRM [id number]. Topic: Clinical - Medication Refill >> Jun 12, 2024 11:08 AM Dedra B wrote: Medication: glipiZIDE  (GLUCOTROL  XL) 2.5 MG 24 hr table  Has the patient contacted their pharmacy? Yes.  (Agent: If yes, when and what did the pharmacy advise?) Pharmacist Fair[facility]ALCON[facility] at [facility] called saying that the patient leaves tomorrow for vacation and even if they expedite her mail order it still will be 3 days before she receives it. Would like the Rx sent to [facility] listed instead.  This is the patient's preferred pharmacy:  [facility] #[id number] [address],  - [address] AT [facility] [address] [place] [address] Phone: [phone] Fax: [phone]  Is this the correct pharmacy for this prescription? Yes If no, delete pharmacy and type the correct one.   Has the prescription been filled recently? No  Is the patient out of the medication? Yes  Has the patient been seen for an appointment in the last year OR does the patient have an upcoming appointment? Yes  Can we respond through MyChart? Yes  Agent: Please be advised that Rx refills may take up to 3 business days. We ask that you follow-up with your pharmacy.

## 2024-06-13 ENCOUNTER — Other Ambulatory Visit: Payer: Medicare Other

## 2024-06-19 ENCOUNTER — Ambulatory Visit: Payer: Medicare Other | Admitting: Internal Medicine

## 2024-06-27 DIAGNOSIS — M1711 Unilateral primary osteoarthritis, right knee: Secondary | ICD-10-CM | POA: Diagnosis not present

## 2024-07-03 DIAGNOSIS — Z008 Encounter for other general examination: Secondary | ICD-10-CM | POA: Diagnosis not present

## 2024-07-03 DIAGNOSIS — E039 Hypothyroidism, unspecified: Secondary | ICD-10-CM | POA: Diagnosis not present

## 2024-07-03 DIAGNOSIS — E663 Overweight: Secondary | ICD-10-CM | POA: Diagnosis not present

## 2024-07-03 DIAGNOSIS — E785 Hyperlipidemia, unspecified: Secondary | ICD-10-CM | POA: Diagnosis not present

## 2024-07-03 DIAGNOSIS — E1169 Type 2 diabetes mellitus with other specified complication: Secondary | ICD-10-CM | POA: Diagnosis not present

## 2024-07-03 DIAGNOSIS — Z6825 Body mass index (BMI) 25.0-25.9, adult: Secondary | ICD-10-CM | POA: Diagnosis not present

## 2024-07-04 DIAGNOSIS — M545 Low back pain, unspecified: Secondary | ICD-10-CM | POA: Diagnosis not present

## 2024-07-15 ENCOUNTER — Telehealth: Payer: Self-pay | Admitting: Internal Medicine

## 2024-07-15 DIAGNOSIS — E1122 Type 2 diabetes mellitus with diabetic chronic kidney disease: Secondary | ICD-10-CM

## 2024-07-15 MED ORDER — ONETOUCH ULTRASOFT LANCETS MISC: 12 refills | Status: AC

## 2024-07-15 NOTE — Telephone Encounter (Signed)
 Copied from CRM [id number]. Topic: Clinical - Medication Refill >> Jul 15, 2024 10:04 AM Delon DASEN wrote: Medication: [employer] (ONE TOUCH DELICA LANCING DEV)  Has the patient contacted their pharmacy? Yes (Agent: If no, request that the patient contact the pharmacy for the refill. If patient does not wish to contact the pharmacy document the reason why and proceed with request.) (Agent: If yes, when and what did the pharmacy advise?)  This is the patient's preferred pharmacy:  [facility] #[id number] [address], [place] - [address] AT [facility] [address] [place] [address] Phone: [phone] Fax: [phone]  Is this the correct pharmacy for this prescription? Yes If no, delete pharmacy and type the correct one.   Has the prescription been filled recently? Yes  Is the patient out of the medication? Yes  Has the patient been seen for an appointment in the last year OR does the patient have an upcoming appointment? Yes  Can we respond through MyChart? Yes  Agent: Please be advised that Rx refills may take up to 3 business days. We ask that you follow-up with your pharmacy.

## 2024-07-17 DIAGNOSIS — M5416 Radiculopathy, lumbar region: Secondary | ICD-10-CM | POA: Diagnosis not present

## 2024-07-18 ENCOUNTER — Other Ambulatory Visit

## 2024-07-18 ENCOUNTER — Telehealth: Payer: Self-pay | Admitting: Internal Medicine

## 2024-07-18 DIAGNOSIS — E1122 Type 2 diabetes mellitus with diabetic chronic kidney disease: Secondary | ICD-10-CM | POA: Diagnosis not present

## 2024-07-18 DIAGNOSIS — E039 Hypothyroidism, unspecified: Secondary | ICD-10-CM

## 2024-07-18 DIAGNOSIS — E119 Type 2 diabetes mellitus without complications: Secondary | ICD-10-CM

## 2024-07-18 DIAGNOSIS — Z0000 Encounter for general adult medical examination without abnormal findings: Secondary | ICD-10-CM

## 2024-07-18 DIAGNOSIS — N183 Chronic kidney disease, stage 3 unspecified: Secondary | ICD-10-CM | POA: Diagnosis not present

## 2024-07-18 DIAGNOSIS — E1169 Type 2 diabetes mellitus with other specified complication: Secondary | ICD-10-CM | POA: Diagnosis not present

## 2024-07-18 DIAGNOSIS — E785 Hyperlipidemia, unspecified: Secondary | ICD-10-CM | POA: Diagnosis not present

## 2024-07-18 NOTE — Progress Notes (Shared)
 Annual Wellness Visit   Patient Care Team: Royal Beirne, Ronal PARAS, MD as PCP - General (Internal Medicine) Livingston Rigg, MD as Consulting Physician (Dermatology)  Visit Date: 07/22/24   Chief Complaint  Patient presents with   Annual Exam   Medicare Wellness   Subjective:  Patient: Paige Ali, Female DOB: April 03, 1946, 78 y.o. MRN: [id number] Vitals:   07/21/24 1454  BP: 126/80   Srihitha Tagliaferri is a 78 y.o. Female who presents today for her Annual Wellness Visit. Patient has Diabetes mellitus (HCC); Hyperlipidemia; Hypothyroidism; and Arthropathy of right shoulder on their problem list.   She said she had a good summer and that she traveled a lot.    She said that her left knee began hurting but she went to an orthopidist where she received an injection that relieved the pain.     History of Diabetes Mellitus, Type II treated with Glipizide  2.5 mg daily with breakfast, Metformin  500 mg twice with meals. 07/15/2024 HgbA1c 7.2% increased from 7.1% she does not want to increase her dosage of glipizide . She says that she will adjust her diet.    History of Hyperlipidemia treated with Atorvastatin  20 mg daily.  07/15/2024 Lipid panel Chol 58, HDL 58, Triglycerides 169, LDL 95. Triglycerides increased from 12/14/23 Triglycerides 162.      History of Hypothyroidism treated with Levothyroxine  75 mcg. 07/18/2024 TSH 0.94   History of right shoulder arthropathy and has seen orthopedist in the  past     Old records indicate she became glucose intolerant around 2006.   Labs 07/15/2024: Blood glucose 142 , HgbA1c 7.2, Tryglycerides 169,   08/24/2023 mammogram No mammographic evidence of malignancy. Repeat in one year.    11/14/2018 Colonoscopy One 7 mm polyp at the ileocecal valve, removed with a cold biopsy forceps. Resected and retrieved. Two 7 to 11 mm polyps in the ascending colon, removed with a cold snare. Resected and retrieved. The polyps were benign but precancerous. Diverticulosis  in the sigmoid colon and in the descending colon. Non- bleeding internal hemorrhoids. Repeat in 3 years.  Due now  Health maintenance: Last Eye exam 10/08/2023 at [facility]. Hepatitis C screening discontinued.      Health Maintenance  Topic Date Due   Colonoscopy  11/14/2021   OPHTHALMOLOGY EXAM  07/16/2024   Mammogram  08/23/2024   Hepatitis C Screening  07/21/2025 (Originally 10/28/1964)   HEMOGLOBIN A1C  01/15/2025   Diabetic kidney evaluation - eGFR measurement  07/18/2025   Diabetic kidney evaluation - Urine ACR  07/18/2025   FOOT EXAM  07/21/2025   Medicare Annual Wellness (AWV)  07/21/2025   DTaP/Tdap/Td (3 - Td or Tdap) 06/21/2033   Pneumococcal Vaccine: 50+ Years  Completed   Influenza Vaccine  Completed   DEXA SCAN  Completed   Zoster Vaccines- Shingrix  Completed   HPV VACCINES  Aged Out   Meningococcal B Vaccine  Aged Out   COVID-19 Vaccine  Discontinued      Review of Systems  Constitutional:  Negative for fever and malaise/fatigue.  HENT:  Negative for congestion.   Eyes:  Negative for blurred vision.  Respiratory:  Negative for cough and shortness of breath.   Cardiovascular:  Negative for chest pain, palpitations and leg swelling.  Gastrointestinal:  Negative for vomiting.  Musculoskeletal:  Negative for back pain.  Skin:  Negative for rash.  Neurological:  Negative for loss of consciousness and headaches.   Objective:  Vitals: body mass index is 25.97 kg/m. Today's Vitals  07/21/24 1454  BP: 126/80  Pulse: (!) 102  SpO2: 97%  Weight: 142 lb (64.4 kg)  Height: 5' 2 (1.575 m)  PainSc: 0-No pain   Physical Exam Vitals and nursing note reviewed.  Constitutional:      General: She is not in acute distress.    Appearance: Normal appearance. She is not ill-appearing or toxic-appearing.  HENT:     Head: Normocephalic and atraumatic.     Right Ear: Hearing, tympanic membrane, ear canal and external ear normal.     Left Ear: Hearing,  tympanic membrane, ear canal and external ear normal.     Mouth/Throat:     Pharynx: Oropharynx is clear.  Eyes:     Extraocular Movements: Extraocular movements intact.     Pupils: Pupils are equal, round, and reactive to light.  Neck:     Thyroid : No thyroid  mass, thyromegaly or thyroid  tenderness.     Vascular: No carotid bruit.  Cardiovascular:     Rate and Rhythm: Normal rate and regular rhythm. No extrasystoles are present.    Pulses:          Dorsalis pedis pulses are 2+ on the right side and 2+ on the left side.     Heart sounds: Normal heart sounds. No murmur heard.    No friction rub. No gallop.  Pulmonary:     Effort: Pulmonary effort is normal.     Breath sounds: Normal breath sounds. No decreased breath sounds, wheezing, rhonchi or rales.  Chest:     Chest wall: No mass.  Abdominal:     Palpations: Abdomen is soft. There is no hepatomegaly, splenomegaly or mass.     Tenderness: There is no abdominal tenderness.     Hernia: No hernia is present.  Musculoskeletal:     Cervical back: Normal range of motion.     Right lower leg: No edema.     Left lower leg: No edema.  Lymphadenopathy:     Cervical: No cervical adenopathy.     Upper Body:     Right upper body: No supraclavicular adenopathy.     Left upper body: No supraclavicular adenopathy.  Skin:    General: Skin is warm and dry.  Neurological:     General: No focal deficit present.     Mental Status: She is alert and oriented to person, place, and time. Mental status is at baseline.     Sensory: Sensation is intact.     Motor: Motor function is intact. No weakness.     Deep Tendon Reflexes: Reflexes are normal and symmetric.  Psychiatric:        Attention and Perception: Attention normal.        Mood and Affect: Mood normal.        Speech: Speech normal.        Behavior: Behavior normal.        Thought Content: Thought content normal.        Cognition and Memory: Cognition normal.        Judgment: Judgment  normal.     Current Outpatient Medications  Medication Instructions   atorvastatin  (LIPITOR) 20 mg, Oral, Daily   Blood Glucose Monitoring Suppl (ONE TOUCH ULTRA 2) w/Device KIT Use 1- 4 times daily as needed.  DX E11.9   calcium  carbonate 250 mg, Daily   clobetasol  (TEMOVATE ) 0.05 % external solution Apply to scalp nightly 30 days per bottle   Cod Liver Oil 10 MINIM CAPS Take by mouth.  glipiZIDE  (GLUCOTROL  XL) 2.5 MG 24 hr tablet TAKE 1 TABLET DAILY WITH BREAKFAST   glucose blood (ONETOUCH ULTRA) test strip USE AS INSTRIUCTED   glucose blood (ONETOUCH ULTRA) test strip Use as instructed   glucose blood test strip Use as instructed   Lancet Devices (ONE TOUCH DELICA LANCING DEV) MISC Use 1- 4 times daily as needed.  DX E11.9   Lancets (ONETOUCH ULTRASOFT) lancets Check glucose with every meal four times a day   levothyroxine  (SYNTHROID ) 75 MCG tablet TAKE 1 TABLET(75 MCG) BY MOUTH DAILY   levothyroxine  (SYNTHROID ) 75 mcg, Oral, Daily   metFORMIN  (GLUCOPHAGE ) 500 MG tablet TAKE 1 TABLET TWICE A DAY WITH MEALS   OneTouch Delica Lancets 30G MISC Use 1- 4 times daily as needed.  DX E11.9   Past Medical History:  Diagnosis Date   Diabetes mellitus    Hyperlipidemia    Thyroid  disease    Medical/Surgical History Narrative:  Allergic/Intolerant to:  Allergies  Allergen Reactions   Codeine Nausea And Vomiting    Past Surgical History:  Procedure Laterality Date   APPENDECTOMY     Family History  Problem Relation Age of Onset   Diabetes Mother    Esophageal cancer Neg Hx    Rectal cancer Neg Hx    Stomach cancer Neg Hx    Colon cancer Neg Hx    Family History Narrative: Her father passed away at nearly 4 years of age due to complications of a leg infection. He lived in [place]. Her mother is still living with history of diabetes. 2 brothers and 2 sisters are healthy.  Social History   Social History Narrative   Native of [place], has been in the [place]  for ~20  years. She has a college degree. Widowed. Non-smoker. Social alcohol consumption. She operates a coffee shop. Daughter lives in [place] . Son lives here. Son operates a coffee company.  native of [place]. Son operates a coffee company and lives here. Daughter lives in [place] . Nonsmoker. Exercises regularly. Rides her bicycle near her home in a safe area.  Most Recent Health Risks Assessment:   Medicare Risk at Home - 07/21/24 1458     Any stairs in or around the home? No    If so, are there any without handrails? No    Home free of loose throw rugs in walkways, pet beds, electrical cords, etc? Yes    Adequate lighting in your home to reduce risk of falls? Yes    Life alert? No    Use of a cane, walker or w/c? No    Grab bars in the bathroom? No    Shower chair or bench in shower? No    Elevated toilet seat or a handicapped toilet? No         Most Recent Social Determinants of Health (Including Hx of Tobacco, Alcohol, and Drug Use) SDOH Screenings   Food Insecurity: No Food Insecurity (06/18/2023)  Housing: Low Risk  (06/18/2023)  Transportation Needs: No Transportation Needs (06/18/2023)  Utilities: Not At Risk (06/18/2023)  Alcohol Screen: Low Risk  (07/21/2024)  Depression (PHQ2-9): Low Risk  (07/21/2024)  Financial Resource Strain: Low Risk  (07/21/2024)  Physical Activity: Sufficiently Active (07/21/2024)  Social Connections: Moderately Integrated (06/18/2023)  Stress: No Stress Concern Present (07/21/2024)  Tobacco Use: Low Risk  (07/21/2024)  Health Literacy: Adequate Health Literacy (06/18/2023)   Social History   Tobacco Use   Smoking status: Never   Smokeless tobacco: Never  Vaping Use  Vaping status: Never Used  Substance Use Topics   Alcohol use: Yes    Alcohol/week: 1.0 - 2.0 standard drink of alcohol    Types: 1 - 2 Glasses of wine per week    Comment: occasional   Drug use: Not Currently   Most Recent Functional Status Assessment:    07/21/2024    2:57 PM  In  your present state of health, do you have any difficulty performing the following activities:  Hearing? 0  Vision? 0  Difficulty concentrating or making decisions? 0  Walking or climbing stairs? 0  Dressing or bathing? 0  Doing errands, shopping? 0  Preparing Food and eating ? N  Using the Toilet? N  In the past six months, have you accidently leaked urine? N  Do you have problems with loss of bowel control? N  Managing your Medications? N  Managing your Finances? N  Housekeeping or managing your Housekeeping? N   Most Recent Fall Risk Assessment:    07/21/2024    2:58 PM  Fall Risk   Falls in the past year? 0  Number falls in past yr: 0  Injury with Fall? 0  Risk for fall due to : No Fall Risks  Follow up Falls prevention discussed;Education provided;Falls evaluation completed   Most Recent Anxiety/Depression Screenings:    07/21/2024    3:03 PM 06/18/2023    3:16 PM  PHQ 2/9 Scores  PHQ - 2 Score 0 0       No data to display         Most Recent Cognitive Screening:    07/21/2024    3:01 PM  6CIT Screen  What Year? 0 points  What month? 0 points  What time? 0 points  Count back from 20 0 points  Months in reverse 0 points  Repeat phrase 0 points  Total Score 0 points   Most Recent Vision/Hearing Screenings:No results found. Results:  Studies Obtained And Personally Reviewed By Me: Diabetic Foot Exam - Simple   Simple Foot Form Visual Inspection No deformities, no ulcerations, no other skin breakdown bilaterally: Yes Sensation Testing Intact to touch and monofilament testing bilaterally: Yes Pulse Check Posterior Tibialis and Dorsalis pulse intact bilaterally: Yes Comments     08/24/2023 mammogram No mammographic evidence of malignancy. Repeat in one year.    11/14/2018 Colonoscopy One 7 mm polyp at the ileocecal valve, removed with a cold biopsy forceps. Resected and retrieved. Two 7 to 11 mm polyps in the ascending colon, removed with a cold snare.  Resected and retrieved. The polyps were benign but precancerous. Diverticulosis in the sigmoid colon and in the descending colon. Non- bleeding internal hemorrhoids. Repeat in 3 years   Labs:  CBC w/ Differential Lab Results  Component Value Date   WBC 6.6 07/18/2024   RBC 4.20 07/18/2024   HGB 13.4 07/18/2024   HCT 40.8 07/18/2024   PLT 267 07/18/2024   MCV 97.1 07/18/2024   MCH 31.9 07/18/2024   MCHC 32.8 07/18/2024   RDW 12.5 07/18/2024   MPV 9.8 07/18/2024   LYMPHSABS 2,343 06/15/2023   MONOABS 512 04/10/2017   BASOSABS 33 07/18/2024    Comprehensive Metabolic Panel Lab Results  Component Value Date   NA 141 07/18/2024   K 4.6 07/18/2024   CL 105 07/18/2024   CO2 24 07/18/2024   GLUCOSE 142 (H) 07/18/2024   BUN 15 07/18/2024   CREATININE 0.75 07/18/2024   CALCIUM  9.5 07/18/2024   PROT 7.2 07/18/2024  ALBUMIN 4.3 04/10/2017   AST 18 07/18/2024   ALT 7 07/18/2024   ALKPHOS 55 04/10/2017   BILITOT 1.1 07/18/2024   EGFR 82 07/18/2024   GFRNONAA 81 05/14/2020   Lipid Panel  Lab Results  Component Value Date   CHOL 180 07/18/2024   HDL 58 07/18/2024   LDLCALC 95 07/18/2024   TRIG 169 (H) 07/18/2024   A1c Lab Results  Component Value Date   HGBA1C 7.2 (H) 07/18/2024    TSH Lab Results  Component Value Date   TSH 0.94 07/18/2024    Assessment & Plan:   Orders Placed This Encounter  Procedures   MM 3D SCREENING MAMMOGRAM BILATERAL BREAST    Standing Status:   Future    Expiration Date:   07/21/2025    Reason for Exam (SYMPTOM  OR DIAGNOSIS REQUIRED):   screening mammogram for breast cancer    Preferred [facility]   Ambulatory referral to Gastroenterology    Referral Priority:   Routine    Referral Type:   Consultation    Referral Reason:   Specialty Services Required    Referred to Provider:   Nandigam, Kavitha V, MD    Number of Visits Requested:   1    Diabetes Mellitus, Type II: treated with Glipizide  2.5 mg daily  with breakfast, Metformin  500 mg twice with meals. 07/15/2024 HgbA1c 7.2% increased from 7.1% she does not want to increase her dosage of glipizide . She says that she will adjust her diet.    Hyperlipidemia: treated with Atorvastatin  20 mg daily.  07/15/2024 Lipid panel Chol 58, HDL 58, Triglycerides 169, LDL 95. Triglycerides increased from 12/14/23 Triglycerides 162.      Hypothyroidism: treated with Levothyroxine  75 mcg. 07/18/2024 TSH 0.94    08/24/2023 mammogram No mammographic evidence of malignancy. Repeat in one year.     Mammogram ordered   11/14/2018 Colonoscopy One 7 mm polyp at the ileocecal valve, removed with a cold biopsy forceps. Resected and retrieved. Two 7 to 11 mm polyps in the ascending colon, removed with a cold snare. Resected and retrieved. The polyps were benign but precancerous. Diverticulosis in the sigmoid colon and in the descending colon. Non- bleeding internal hemorrhoids. Repeat in 3 years.  Due now   Referred to [facility].   Health maintenance: Last Eye exam 10/08/2023 at [facility]. Hepatitis C screening discontinued.      No follow-ups on file.   Annual Wellness Visit done today including the all of the following: Reviewed patient's Family Medical History Reviewed patient's SDOH and reviewed tobacco, alcohol, and drug use.  Reviewed and updated list of patient's medical providers Assessment of cognitive impairment was done Assessed patient's functional ability Established a written schedule for health screening services Health Risk Assessent Completed and Reviewed  Discussed health benefits of physical activity, and encouraged her to engage in regular exercise appropriate for her age and condition.    I,Makayla C Reid,acting as a scribe for Ronal JINNY Hailstone, MD.,have documented all relevant documentation on the behalf of Ronal JINNY Hailstone, MD,as directed by  Ronal JINNY Hailstone, MD while in the presence of Ronal JINNY Hailstone, MD.   I, Ronal JINNY Hailstone, MD, have reviewed all documentation for and agree with the above Annual Wellness Visit documentation.  Ronal JINNY Hailstone, MD Internal Medicine 07/21/2024

## 2024-07-19 LAB — CBC WITH DIFFERENTIAL/PLATELET
Absolute Lymphocytes: 2831 {cells}/uL (ref 850–3900)
Absolute Monocytes: 488 {cells}/uL (ref 200–950)
Basophils Absolute: 33 {cells}/uL (ref 0–200)
Basophils Relative: 0.5 %
Eosinophils Absolute: 99 {cells}/uL (ref 15–500)
Eosinophils Relative: 1.5 %
HCT: 40.8 % (ref 35.0–45.0)
Hemoglobin: 13.4 g/dL (ref 11.7–15.5)
MCH: 31.9 pg (ref 27.0–33.0)
MCHC: 32.8 g/dL (ref 32.0–36.0)
MCV: 97.1 fL (ref 80.0–100.0)
MPV: 9.8 fL (ref 7.5–12.5)
Monocytes Relative: 7.4 %
Neutro Abs: 3148 {cells}/uL (ref 1500–7800)
Neutrophils Relative %: 47.7 %
Platelets: 267 Thousand/uL (ref 140–400)
RBC: 4.2 Million/uL (ref 3.80–5.10)
RDW: 12.5 % (ref 11.0–15.0)
Total Lymphocyte: 42.9 %
WBC: 6.6 Thousand/uL (ref 3.8–10.8)

## 2024-07-19 LAB — TSH: TSH: 0.94 m[IU]/L (ref 0.40–4.50)

## 2024-07-19 LAB — COMPREHENSIVE METABOLIC PANEL WITH GFR
AG Ratio: 2 (calc) (ref 1.0–2.5)
ALT: 7 U/L (ref 6–29)
AST: 18 U/L (ref 10–35)
Albumin: 4.8 g/dL (ref 3.6–5.1)
Alkaline phosphatase (APISO): 51 U/L (ref 37–153)
BUN: 15 mg/dL (ref 7–25)
CO2: 24 mmol/L (ref 20–32)
Calcium: 9.5 mg/dL (ref 8.6–10.4)
Chloride: 105 mmol/L (ref 98–110)
Creat: 0.75 mg/dL (ref 0.60–1.00)
Globulin: 2.4 g/dL (ref 1.9–3.7)
Glucose, Bld: 142 mg/dL — ABNORMAL HIGH (ref 65–99)
Potassium: 4.6 mmol/L (ref 3.5–5.3)
Sodium: 141 mmol/L (ref 135–146)
Total Bilirubin: 1.1 mg/dL (ref 0.2–1.2)
Total Protein: 7.2 g/dL (ref 6.1–8.1)
eGFR: 82 mL/min/1.73m2 (ref >=60)

## 2024-07-19 LAB — MICROALBUMIN / CREATININE URINE RATIO
Creatinine, Urine: 92 mg/dL (ref 20–275)
Microalb Creat Ratio: 13 mg/g{creat} (ref <30)
Microalb, Ur: 1.2 mg/dL

## 2024-07-19 LAB — LIPID PANEL
Cholesterol: 180 mg/dL (ref <200)
HDL: 58 mg/dL (ref >=50)
LDL Cholesterol (Calc): 95 mg/dL
Non-HDL Cholesterol (Calc): 122 mg/dL (ref <130)
Total CHOL/HDL Ratio: 3.1 (calc) (ref <5.0)
Triglycerides: 169 mg/dL — ABNORMAL HIGH (ref <150)

## 2024-07-19 LAB — HEMOGLOBIN A1C
Hgb A1c MFr Bld: 7.2 % — ABNORMAL HIGH (ref <5.7)
Mean Plasma Glucose: 160 mg/dL
eAG (mmol/L): 8.9 mmol/L

## 2024-07-21 ENCOUNTER — Encounter: Payer: Self-pay | Admitting: Internal Medicine

## 2024-07-21 ENCOUNTER — Ambulatory Visit (INDEPENDENT_AMBULATORY_CARE_PROVIDER_SITE_OTHER): Admitting: Internal Medicine

## 2024-07-21 VITALS — BP 126/80 | HR 102 | Ht 62.0 in | Wt 142.0 lb

## 2024-07-21 DIAGNOSIS — E785 Hyperlipidemia, unspecified: Secondary | ICD-10-CM

## 2024-07-21 DIAGNOSIS — Z7984 Long term (current) use of oral hypoglycemic drugs: Secondary | ICD-10-CM | POA: Diagnosis not present

## 2024-07-21 DIAGNOSIS — Z1211 Encounter for screening for malignant neoplasm of colon: Secondary | ICD-10-CM

## 2024-07-21 DIAGNOSIS — Z1231 Encounter for screening mammogram for malignant neoplasm of breast: Secondary | ICD-10-CM

## 2024-07-21 DIAGNOSIS — E1169 Type 2 diabetes mellitus with other specified complication: Secondary | ICD-10-CM | POA: Diagnosis not present

## 2024-07-21 DIAGNOSIS — E119 Type 2 diabetes mellitus without complications: Secondary | ICD-10-CM

## 2024-07-21 DIAGNOSIS — E039 Hypothyroidism, unspecified: Secondary | ICD-10-CM | POA: Diagnosis not present

## 2024-07-21 DIAGNOSIS — Z0000 Encounter for general adult medical examination without abnormal findings: Secondary | ICD-10-CM

## 2024-07-21 NOTE — Progress Notes (Unsigned)
 Subjective:   Paige Ali is a 78 y.o. female who presents for Medicare Annual (Subsequent) preventive examination.  Visit Complete: In person  Patient Medicare AWV questionnaire was completed by the patient on 07/21/2024; I have confirmed that all information answered by patient is correct and no changes since this date.  Cardiac Risk Factors include: advanced age (>32men, >45 women);diabetes mellitus;dyslipidemia     Objective:    Today's Vitals   07/21/24 1454  BP: 126/80  Pulse: (!) 102  SpO2: 97%  Weight: 142 lb (64.4 kg)  Height: 5' 2 (1.575 m)  PainSc: 0-No pain   Body mass index is 25.97 kg/m.     07/21/2024    2:58 PM 06/18/2023    3:21 PM 05/22/2022   11:31 AM  Advanced Directives  Does Patient Have a Medical Advance Directive? No No No  Would patient like information on creating a medical advance directive? No - Patient declined No - Patient declined Yes (MAU/Ambulatory/Procedural Areas - Information given)    Current Medications (verified) Outpatient Encounter Medications as of 07/21/2024  Medication Sig   Lancets (ONETOUCH ULTRASOFT) lancets Check glucose with every meal four times a day   atorvastatin  (LIPITOR) 20 MG tablet Take 1 tablet (20 mg total) by mouth daily.   Blood Glucose Monitoring Suppl (ONE TOUCH ULTRA 2) w/Device KIT Use 1- 4 times daily as needed.  DX E11.9   calcium  carbonate 200 MG capsule Take 250 mg by mouth daily.    clobetasol  (TEMOVATE ) 0.05 % external solution Apply to scalp nightly 30 days per bottle   Cod Liver Oil 10 MINIM CAPS Take by mouth.   glipiZIDE  (GLUCOTROL  XL) 2.5 MG 24 hr tablet TAKE 1 TABLET DAILY WITH BREAKFAST   glucose blood (ONETOUCH ULTRA) test strip USE AS INSTRIUCTED   glucose blood (ONETOUCH ULTRA) test strip Use as instructed   glucose blood test strip Use as instructed   Lancet Devices (ONE TOUCH DELICA LANCING DEV) MISC Use 1- 4 times daily as needed.  DX E11.9   levothyroxine  (SYNTHROID ) 75 MCG tablet  Take 1 tablet (75 mcg total) by mouth daily.   levothyroxine  (SYNTHROID ) 75 MCG tablet TAKE 1 TABLET(75 MCG) BY MOUTH DAILY   metFORMIN  (GLUCOPHAGE ) 500 MG tablet TAKE 1 TABLET TWICE A DAY WITH MEALS   OneTouch Delica Lancets 30G MISC Use 1- 4 times daily as needed.  DX E11.9   No facility-administered encounter medications on file as of 07/21/2024.    Allergies (verified) Codeine   History: Past Medical History:  Diagnosis Date   Diabetes mellitus    Hyperlipidemia    Thyroid  disease    Past Surgical History:  Procedure Laterality Date   APPENDECTOMY     Family History  Problem Relation Age of Onset   Diabetes Mother    Esophageal cancer Neg Hx    Rectal cancer Neg Hx    Stomach cancer Neg Hx    Colon cancer Neg Hx    Social History   Socioeconomic History   Marital status: Married    Spouse name: Not on file   Number of children: Not on file   Years of education: Not on file   Highest education level: Not on file  Occupational History   Not on file  Tobacco Use   Smoking status: Never   Smokeless tobacco: Never  Vaping Use   Vaping status: Never Used  Substance and Sexual Activity   Alcohol use: Yes    Alcohol/week: 1.0 - 2.0  standard drink of alcohol    Types: 1 - 2 Glasses of wine per week    Comment: occasional   Drug use: Not Currently   Sexual activity: Not on file  Other Topics Concern   Not on file  Social History Narrative   Native of [place], has been in the [place]  for ~20 years. She has a college degree. Widowed. Non-smoker. Social alcohol consumption. She operates a coffee shop. Daughter lives in [place] . Son lives here. Son operates a coffee company.   Social Drivers of Corporate investment banker Strain: Low Risk  (07/21/2024)   Overall Financial Resource Strain (CARDIA)    Difficulty of Paying Living Expenses: Not hard at all  Food Insecurity: No Food Insecurity (06/18/2023)   Hunger Vital Sign    Worried About Running Out of Food  in the Last Year: Never true    Ran Out of Food in the Last Year: Never true  Transportation Needs: No Transportation Needs (06/18/2023)   PRAPARE - Administrator, Civil Service (Medical): No    Lack of Transportation (Non-Medical): No  Physical Activity: Sufficiently Active (07/21/2024)   Exercise Vital Sign    Days of Exercise per Week: 7 days    Minutes of Exercise per Session: 30 min  Stress: No Stress Concern Present (07/21/2024)   [employer] of Occupational Health - Occupational Stress Questionnaire    Feeling of Stress: Not at all  Social Connections: Moderately Integrated (06/18/2023)   Social Connection and Isolation Panel    Frequency of Communication with Friends and Family: Three times a week    Frequency of Social Gatherings with Friends and Family: Three times a week    Attends Religious Services: 1 to 4 times per year    Active Member of Clubs or Organizations: No    Attends Engineer, structural: More than 4 times per year    Marital Status: Widowed    Tobacco Counseling Counseling given: No   Clinical Intake:     Pain Score: 0-No pain                  Activities of Daily Living    07/21/2024    2:57 PM  In your present state of health, do you have any difficulty performing the following activities:  Hearing? 0  Vision? 0  Difficulty concentrating or making decisions? 0  Walking or climbing stairs? 0  Dressing or bathing? 0  Doing errands, shopping? 0  Preparing Food and eating ? N  Using the Toilet? N  In the past six months, have you accidently leaked urine? N  Do you have problems with loss of bowel control? N  Managing your Medications? N  Managing your Finances? N  Housekeeping or managing your Housekeeping? N    Patient Care Team: Perri Ronal PARAS, MD as PCP - General (Internal Medicine) Livingston Rigg, MD as Consulting Physician (Dermatology)  Indicate any recent Medical Services you may have received from  other than [facility] providers in the past year (date may be approximate).     Assessment:   This is a routine wellness examination for Paige Ali.  Hearing/Vision screen No results found.   Goals Addressed   None    Depression Screen    07/21/2024    3:03 PM 06/18/2023    3:16 PM 06/18/2023    3:15 PM 05/22/2022   11:32 AM 05/19/2021   10:11 AM 05/19/2021   10:10 AM 05/17/2020  11:04 AM  PHQ 2/9 Scores  PHQ - 2 Score 0 0 0 0 0 0 0  Exception Documentation   Patient refusal        Fall Risk    07/21/2024    2:58 PM 06/18/2023    3:22 PM 06/18/2023    3:15 PM 05/22/2022   11:32 AM 05/19/2021   10:10 AM  Fall Risk   Falls in the past year? 0 0 0 0 0  Number falls in past yr: 0 0 0 0 0  Injury with Fall? 0 0 0 0 0  Risk for fall due to : No Fall Risks No Fall Risks No Fall Risks  No Fall Risks  Follow up Falls prevention discussed;Education provided;Falls evaluation completed    Falls evaluation completed      Data saved with a previous flowsheet row definition    MEDICARE RISK AT HOME: Medicare Risk at Home Any stairs in or around the home?: No If so, are there any without handrails?: No Home free of loose throw rugs in walkways, pet beds, electrical cords, etc?: Yes Adequate lighting in your home to reduce risk of falls?: Yes Life alert?: No Use of a cane, walker or w/c?: No Grab bars in the bathroom?: No Shower chair or bench in shower?: No Elevated toilet seat or a handicapped toilet?: No  TIMED UP AND GO:  Was the test performed?  No    Cognitive Function:        07/21/2024    3:01 PM 06/18/2023    3:19 PM  6CIT Screen  What Year? 0 points 0 points  What month? 0 points 0 points  What time? 0 points 0 points  Count back from 20 0 points 0 points  Months in reverse 0 points 0 points  Repeat phrase 0 points 0 points  Total Score 0 points 0 points    Immunizations Immunization History  Administered Date(s) Administered   INFLUENZA, HIGH DOSE SEASONAL PF  07/17/2017, 07/01/2019, 07/10/2024   Influenza Split 07/06/2012   Influenza,inj,Quad PF,6+ Mos 07/30/2019   Influenza-Unspecified 08/30/2018, 07/14/2020, 07/25/2021   PFIZER(Purple Top)SARS-COV-2 Vaccination 12/07/2019, 12/31/2019, 08/18/2020, 02/01/2021   Pneumococcal Conjugate-13 04/08/2015   Pneumococcal Polysaccharide-23 05/06/2018   Tdap 09/19/2012, 06/22/2023   Zoster Recombinant(Shingrix) 02/06/2018, 05/14/2018   Zoster, Live 11/23/2012    TDAP status: Up to date  Flu Vaccine status: Up to date  Pneumococcal vaccine status: Up to date  Covid-19 vaccine status: Information provided on how to obtain vaccines.   Qualifies for Shingles Vaccine? Yes   Zostavax completed Yes   Shingrix Completed?: Yes  Screening Tests Health Maintenance  Topic Date Due   Colonoscopy  11/14/2021   OPHTHALMOLOGY EXAM  07/16/2024   Mammogram  08/23/2024   Hepatitis C Screening  07/21/2025 (Originally 10/28/1964)   HEMOGLOBIN A1C  01/15/2025   Diabetic kidney evaluation - eGFR measurement  07/18/2025   Diabetic kidney evaluation - Urine ACR  07/18/2025   Medicare Annual Wellness (AWV)  07/18/2025   FOOT EXAM  07/21/2025   DTaP/Tdap/Td (3 - Td or Tdap) 06/21/2033   Pneumococcal Vaccine: 50+ Years  Completed   Influenza Vaccine  Completed   DEXA SCAN  Completed   Zoster Vaccines- Shingrix  Completed   HPV VACCINES  Aged Out   Meningococcal B Vaccine  Aged Out   COVID-19 Vaccine  Discontinued    Health Maintenance  Health Maintenance Due  Topic Date Due   Colonoscopy  11/14/2021   OPHTHALMOLOGY EXAM  07/16/2024   Mammogram  08/23/2024    Colorectal cancer screening: Referral to GI placed 07/21/24. Pt aware the office will call re: appt.  Mammogram status: Completed 08/24/2023. Repeat every year  Bone Density status: Completed 08/11/2022. Results reflect: Bone density results: NORMAL. Repeat every 2 years.  Lung Cancer Screening: (Low Dose CT Chest recommended if Age 43-80 years,  20 pack-year currently smoking OR have quit w/in 15years.) does not qualify.   Additional Screening:  Hepatitis C Screening: does not qualify; Completed   Vision Screening: Recommended annual ophthalmology exams for early detection of glaucoma and other disorders of the eye. Is the patient up to date with their annual eye exam?  No  Who is the provider or what is the name of the office in which the patient attends annual eye exams? [facility]  If pt is not established with a provider, would they like to be referred to a provider to establish care? No , patient states she will call to book appointment.   Dental Screening: Recommended annual dental exams for proper oral hygiene  Diabetic Foot Exam: Diabetic Foot Exam: Completed 07/21/2024  Community Resource Referral / Chronic Care Management: CRR required this visit?  No   CCM required this visit?  No     Plan:     I have personally reviewed and noted the following in the patient's chart:   Medical and social history Use of alcohol, tobacco or illicit drugs  Current medications and supplements including opioid prescriptions. Patient is not currently taking opioid prescriptions. Functional ability and status Nutritional status Physical activity Advanced directives List of other physicians Hospitalizations, surgeries, and ER visits in previous 12 months Vitals Screenings to include cognitive, depression, and falls Referrals and appointments  In addition, I have reviewed and discussed with patient certain preventive protocols, quality metrics, and best practice recommendations. A written personalized care plan for preventive services as well as general preventive health recommendations were provided to patient.     Kati Riggenbach Zelda, CMA   07/21/2024   After Visit Summary: (In Person-Printed) AVS printed and given to the patient   I, Ronal JINNY Hailstone, MD, have reviewed all documentation for this visit. The  documentation on 07/21/2024 for the exam, diagnosis, procedures, and orders are all accurate and complete.   I have reviewed and agree with the above Annual Wellness Visit documentation.  Ronal Norleen Hailstone, MD. Internal Medicine 07/21/2024

## 2024-07-21 NOTE — Patient Instructions (Signed)
 Next appointment: Follow up in one year for your annual wellness visit    Preventive Care 65 Years and Older, Female Preventive care refers to lifestyle choices and visits with your health care provider that can promote health and wellness. What does preventive care include? A yearly physical exam. This is also called an annual well check. Dental exams once or twice a year. Routine eye exams. Ask your health care provider how often you should have your eyes checked. Personal lifestyle choices, including: Daily care of your teeth and gums. Regular physical activity. Eating a healthy diet. Avoiding tobacco and drug use. Limiting alcohol use. Practicing safe sex. Taking low-dose aspirin every day. Taking vitamin and mineral supplements as recommended by your health care provider. What happens during an annual well check? The services and screenings done by your health care provider during your annual well check will depend on your age, overall health, lifestyle risk factors, and family history of disease. Counseling  Your health care provider may ask you questions about your: Alcohol use. Tobacco use. Drug use. Emotional well-being. Home and relationship well-being. Sexual activity. Eating habits. History of falls. Memory and ability to understand (cognition). Work and work Astronomer. Reproductive health. Screening  You may have the following tests or measurements: Height, weight, and BMI. Blood pressure. Lipid and cholesterol levels. These may be checked every 5 years, or more frequently if you are over 17 years old. Skin check. Lung cancer screening. You may have this screening every year starting at age 19 if you have a 30-pack-year history of smoking and currently smoke or have quit within the past 15 years. Fecal occult blood test (FOBT) of the stool. You may have this test every year starting at age 31. Flexible sigmoidoscopy or colonoscopy. You may have a  sigmoidoscopy every 5 years or a colonoscopy every 10 years starting at age 58. Hepatitis C blood test. Hepatitis B blood test. Sexually transmitted disease (STD) testing. Diabetes screening. This is done by checking your blood sugar (glucose) after you have not eaten for a while (fasting). You may have this done every 1-3 years. Bone density scan. This is done to screen for osteoporosis. You may have this done starting at age 77. Mammogram. This may be done every 1-2 years. Talk to your health care provider about how often you should have regular mammograms. Talk with your health care provider about your test results, treatment options, and if necessary, the need for more tests. Vaccines  Your health care provider may recommend certain vaccines, such as: Influenza vaccine. This is recommended every year. Tetanus, diphtheria, and acellular pertussis (Tdap, Td) vaccine. You may need a Td booster every 10 years. Zoster vaccine. You may need this after age 52. Pneumococcal 13-valent conjugate (PCV13) vaccine. One dose is recommended after age 18. Pneumococcal polysaccharide (PPSV23) vaccine. One dose is recommended after age 25. Talk to your health care provider about which screenings and vaccines you need and how often you need them. This information is not intended to replace advice given to you by your health care provider. Make sure you discuss any questions you have with your health care provider. Document Released: 11/12/2015 Document Revised: 07/05/2016 Document Reviewed: 08/17/2015 Elsevier Interactive Patient Education  2017 [employer].  Fall Prevention in the Home Falls can cause injuries. They can happen to people of all ages. There are many things you can do to make your home safe and to help prevent falls. What can I do on the outside of  my home? Regularly fix the edges of walkways and driveways and fix any cracks. Remove anything that might make you trip as you walk through a  door, such as a raised step or threshold. Trim any bushes or trees on the path to your home. Use bright outdoor lighting. Clear any walking paths of anything that might make someone trip, such as rocks or tools. Regularly check to see if handrails are loose or broken. Make sure that both sides of any steps have handrails. Any raised decks and porches should have guardrails on the edges. Have any leaves, snow, or ice cleared regularly. Use sand or salt on walking paths during winter. Clean up any spills in your garage right away. This includes oil or grease spills. What can I do in the bathroom? Use night lights. Install grab bars by the toilet and in the tub and shower. Do not use towel bars as grab bars. Use non-skid mats or decals in the tub or shower. If you need to sit down in the shower, use a plastic, non-slip stool. Keep the floor dry. Clean up any water that spills on the floor as soon as it happens. Remove soap buildup in the tub or shower regularly. Attach bath mats securely with double-sided non-slip rug tape. Do not have throw rugs and other things on the floor that can make you trip. What can I do in the bedroom? Use night lights. Make sure that you have a light by your bed that is easy to reach. Do not use any sheets or blankets that are too big for your bed. They should not hang down onto the floor. Have a firm chair that has side arms. You can use this for support while you get dressed. Do not have throw rugs and other things on the floor that can make you trip. What can I do in the kitchen? Clean up any spills right away. Avoid walking on wet floors. Keep items that you use a lot in easy-to-reach places. If you need to reach something above you, use a strong step stool that has a grab bar. Keep electrical cords out of the way. Do not use floor polish or wax that makes floors slippery. If you must use wax, use non-skid floor wax. Do not have throw rugs and other things  on the floor that can make you trip. What can I do with my stairs? Do not leave any items on the stairs. Make sure that there are handrails on both sides of the stairs and use them. Fix handrails that are broken or loose. Make sure that handrails are as long as the stairways. Check any carpeting to make sure that it is firmly attached to the stairs. Fix any carpet that is loose or worn. Avoid having throw rugs at the top or bottom of the stairs. If you do have throw rugs, attach them to the floor with carpet tape. Make sure that you have a light switch at the top of the stairs and the bottom of the stairs. If you do not have them, ask someone to add them for you. What else can I do to help prevent falls? Wear shoes that: Do not have high heels. Have rubber bottoms. Are comfortable and fit you well. Are closed at the toe. Do not wear sandals. If you use a stepladder: Make sure that it is fully opened. Do not climb a closed stepladder. Make sure that both sides of the stepladder are locked into place. Ask  someone to hold it for you, if possible. Clearly mark and make sure that you can see: Any grab bars or handrails. First and last steps. Where the edge of each step is. Use tools that help you move around (mobility aids) if they are needed. These include: Canes. Walkers. Scooters. Crutches. Turn on the lights when you go into a dark area. Replace any light bulbs as soon as they burn out. Set up your furniture so you have a clear path. Avoid moving your furniture around. If any of your floors are uneven, fix them. If there are any pets around you, be aware of where they are. Review your medicines with your doctor. Some medicines can make you feel dizzy. This can increase your chance of falling. Ask your doctor what other things that you can do to help prevent falls. This information is not intended to replace advice given to you by your health care provider. Make sure you discuss any  questions you have with your health care provider. Document Released: 08/12/2009 Document Revised: 03/23/2016 Document Reviewed: 11/20/2014 Elsevier Interactive Patient Education  2017 [employer].

## 2024-07-24 ENCOUNTER — Encounter: Payer: Self-pay | Admitting: Internal Medicine

## 2024-08-27 LAB — HM MAMMOGRAPHY

## 2024-08-29 ENCOUNTER — Encounter: Payer: Self-pay | Admitting: Internal Medicine

## 2024-10-02 ENCOUNTER — Ambulatory Visit: Admitting: Internal Medicine

## 2024-10-02 VITALS — BP 150/96 | HR 89 | Temp 98.0°F | Ht 62.0 in | Wt 140.1 lb

## 2024-10-02 DIAGNOSIS — E119 Type 2 diabetes mellitus without complications: Secondary | ICD-10-CM | POA: Diagnosis not present

## 2024-10-02 DIAGNOSIS — Z860101 Personal history of adenomatous and serrated colon polyps: Secondary | ICD-10-CM

## 2024-10-02 DIAGNOSIS — I10 Essential (primary) hypertension: Secondary | ICD-10-CM | POA: Diagnosis not present

## 2024-10-02 DIAGNOSIS — E785 Hyperlipidemia, unspecified: Secondary | ICD-10-CM | POA: Diagnosis not present

## 2024-10-02 DIAGNOSIS — E039 Hypothyroidism, unspecified: Secondary | ICD-10-CM

## 2024-10-02 DIAGNOSIS — Z7984 Long term (current) use of oral hypoglycemic drugs: Secondary | ICD-10-CM

## 2024-10-02 DIAGNOSIS — E1169 Type 2 diabetes mellitus with other specified complication: Secondary | ICD-10-CM

## 2024-10-02 MED ORDER — LOSARTAN POTASSIUM 25 MG PO TABS: 25.0000 mg | ORAL_TABLET | Freq: Every day | ORAL | 0 refills | Status: AC

## 2024-10-02 NOTE — Patient Instructions (Addendum)
 Begin cozaar  25 mg daily to help stabilize BP  fluctuation with hx of Diabetes. Return in 4 weeks.

## 2024-10-02 NOTE — Progress Notes (Shared)
 Patient Care Team: Perri Ronal PARAS, MD as PCP - General (Internal Medicine) Livingston Rigg, MD as Consulting Physician (Dermatology)  Visit Date: 10/02/24  Subjective:    Patient ID: Paige Ali , Female   DOB: [birth year], 78 y.o.    MRN: [id number]   78 y.o. Female presents today for Elevated blood pressure. Patient has a past medical history of Hyperlipidemia, Diabetes Mellitus, Type II, Hypothyroidism.  She said that her blood pressure has been elevated. She said that she hasn't been stressed just excited due to the holidays. Blood pressure today is elevated at 150/96 and 146/90 on recheck.  She says it was higher in the morning and as the day goes on it lowers. She said that one day last week her blood pressure was 160 systolic. She says that she doesn't feel bad.   History of Hyperlipidemia treated with Atorvastatin  20 mg.  07/18/2024 Lipid panel triglycerides 169, otherwise WNL.   History of Diabetes Mellitus, Type II treated with Glipizide  2.5 mg daily with breakfast, Metformin  500 mg twice with meals. 07/15/2024 HgbA1c 7.2%    Vaccine counseling: UTD on Influenza vaccine.    Health Maintenance: Colonoscopy due.  Past Medical History:  Diagnosis Date   Diabetes mellitus    Hyperlipidemia    Thyroid  disease      Family History  Problem Relation Age of Onset   Diabetes Mother    Esophageal cancer Neg Hx    Rectal cancer Neg Hx    Stomach cancer Neg Hx    Colon cancer Neg Hx     Social History   Social History Narrative   Native of [place], has been in the [place]  for ~20 years. She has a college degree. Widowed. Non-smoker. Social alcohol consumption. She operates a coffee shop. Daughter lives in [place] . Son lives here. Son operates a coffee company.      Review of Systems  All other systems reviewed and are negative.       Objective:   Vitals: BP (!) 150/96   Pulse 89   Temp 98 F (36.7 C) (Tympanic)   Ht 5' 2 (1.575 m)   Wt 140 lb 1.9 oz  (63.6 kg)   SpO2 98%   BMI 25.63 kg/m    Physical Exam Vitals and nursing note reviewed.       Results:    Labs:       Component Value Date/Time   NA 141 07/18/2024 0944   K 4.6 07/18/2024 0944   CL 105 07/18/2024 0944   CO2 24 07/18/2024 0944   GLUCOSE 142 (H) 07/18/2024 0944   BUN 15 07/18/2024 0944   CREATININE 0.75 07/18/2024 0944   CALCIUM  9.5 07/18/2024 0944   PROT 7.2 07/18/2024 0944   ALBUMIN 4.3 04/10/2017 1007   AST 18 07/18/2024 0944   ALT 7 07/18/2024 0944   ALKPHOS 55 04/10/2017 1007   BILITOT 1.1 07/18/2024 0944   GFRNONAA 81 05/14/2020 1142   GFRAA 94 05/14/2020 1142     Lab Results  Component Value Date   WBC 6.6 07/18/2024   HGB 13.4 07/18/2024   HCT 40.8 07/18/2024   MCV 97.1 07/18/2024   PLT 267 07/18/2024    Lab Results  Component Value Date   CHOL 180 07/18/2024   HDL 58 07/18/2024   LDLCALC 95 07/18/2024   TRIG 169 (H) 07/18/2024   CHOLHDL 3.1 07/18/2024    Lab Results  Component Value Date   HGBA1C 7.2 (H) 07/18/2024  Lab Results  Component Value Date   TSH 0.94 07/18/2024        Assessment & Plan:   Meds ordered this encounter  Medications   losartan  (COZAAR ) 25 MG tablet    Sig: Take 1 tablet (25 mg total) by mouth daily.    Dispense:  90 tablet    Refill:  0   Orders Placed This Encounter  Procedures   Amb Referral to Colonoscopy    Referral Priority:   Routine    Referral Type:   Consultation    Number of Visits Requested:   1    Elevated blood pressure: She said that her blood pressure has been elevated. She said that she hasn't been stressed just excited due to the holidays. Blood pressure today is elevated at 150/96 and 146/90 on recheck.  She says it was higher in the morning and as the day goes on it lowers. She said that one day last week her blood pressure was 160 systolic. She says that she doesn't feel bad.   Losartan  25 mg daily prescribed.    Keep record of blood pressure for one month follow  up.    Hyperlipidemia: treated with Atorvastatin  20 mg.  07/18/2024 Lipid panel triglycerides 169, otherwise WNL.   Diabetes Mellitus, Type II: treated with Glipizide  2.5 mg daily with breakfast, Metformin  500 mg twice with meals. 07/15/2024 HgbA1c 7.2%    Vaccine counseling: UTD on Influenza vaccine.    Health Maintenance: Colonoscopy due.   Referred to Gastroenterology   I,Makayla C Reid,acting as a scribe for Ronal JINNY Hailstone, MD.,have documented all relevant documentation on the behalf of Ronal JINNY Hailstone, MD,as directed by  Ronal JINNY Hailstone, MD while in the presence of Ronal JINNY Hailstone, MD.

## 2024-10-03 ENCOUNTER — Ambulatory Visit: Admitting: Internal Medicine

## 2024-10-09 NOTE — Telephone Encounter (Signed)
 done

## 2024-10-18 ENCOUNTER — Encounter: Payer: Self-pay | Admitting: Internal Medicine

## 2024-10-20 NOTE — Progress Notes (Signed)
 "   Patient Care Team: Perri Ronal PARAS, MD as PCP - General (Internal Medicine) Livingston Rigg, MD as Consulting Physician (Dermatology)  Visit Date: 11/03/2024  Subjective:    Patient ID: Paige Ali , Female   DOB: 13-Jun-1946, 78 y.o.    MRN: [id number]   78 y.o. Female presents today for 1 month follow up for hypertension. Patient has a past medical history of Hyperlipidemia, Diabetes Mellitus, Type II, Hypothyroidism.  History of Hypertension treated with Losartan  25 mg daily. She was last seen here on 10/02/2024. She said that her blood pressure had been elevated. Blood pressure in office was elevated at 150/96 and 146/90 on recheck.  Blood pressure today is normal at  110/80.   History of Hyperlipidemia treated with Atorvastatin  20 mg.      History of Diabetes Mellitus, Type II treated with Glipizide  2.5 mg daily with breakfast, Metformin  500 mg twice with meals.    Health maintenance: Had eye exam last month.   Past Medical History:  Diagnosis Date   Diabetes mellitus    Hyperlipidemia    Thyroid  disease      Family History  Problem Relation Age of Onset   Diabetes Mother    Esophageal cancer Neg Hx    Rectal cancer Neg Hx    Stomach cancer Neg Hx    Colon cancer Neg Hx     Social History   Social History Narrative   Native of [place], has been in [place]  for ~[place] years. She has a college degree. Widowed. Non-smoker. Social alcohol consumption. She operates a coffee shop. Daughter lives in [place] . Son lives here. Son operates a coffee company.      Review of Systems  All other systems reviewed and are negative.       Objective:   Vitals: BP 110/80   Pulse 86   Ht 5' 2 (1.575 m)   Wt 141 lb (64 kg)   SpO2 97%   BMI 25.79 kg/m    Physical Exam Vitals and nursing note reviewed.  Constitutional:      General: She is not in acute distress.    Appearance: Normal appearance. She is not toxic-appearing.  HENT:     Head: Normocephalic and  atraumatic.  Cardiovascular:     Rate and Rhythm: Normal rate and regular rhythm. No extrasystoles are present.    Pulses: Normal pulses.     Heart sounds: Normal heart sounds. No murmur heard.    No friction rub. No gallop.  Pulmonary:     Effort: Pulmonary effort is normal. No respiratory distress.     Breath sounds: Normal breath sounds. No wheezing or rales.  Skin:    General: Skin is warm and dry.  Neurological:     Mental Status: She is alert and oriented to person, place, and time. Mental status is at baseline.  Psychiatric:        Mood and Affect: Mood normal.        Behavior: Behavior normal.        Thought Content: Thought content normal.        Judgment: Judgment normal.       Results:    Labs:       Component Value Date/Time   NA 141 07/18/2024 0944   K 4.6 07/18/2024 0944   CL 105 07/18/2024 0944   CO2 24 07/18/2024 0944   GLUCOSE 142 (H) 07/18/2024 0944   BUN 15 07/18/2024 0944   CREATININE 0.75 07/18/2024  0944   CALCIUM  9.5 07/18/2024 0944   PROT 7.2 07/18/2024 0944   ALBUMIN 4.3 04/10/2017 1007   AST 18 07/18/2024 0944   ALT 7 07/18/2024 0944   ALKPHOS 55 04/10/2017 1007   BILITOT 1.1 07/18/2024 0944   GFRNONAA 81 05/14/2020 1142   GFRAA 94 05/14/2020 1142     Lab Results  Component Value Date   WBC 6.6 07/18/2024   HGB 13.4 07/18/2024   HCT 40.8 07/18/2024   MCV 97.1 07/18/2024   PLT 267 07/18/2024    Lab Results  Component Value Date   CHOL 180 07/18/2024   HDL 58 07/18/2024   LDLCALC 95 07/18/2024   TRIG 169 (H) 07/18/2024   CHOLHDL 3.1 07/18/2024    Lab Results  Component Value Date   HGBA1C 7.2 (H) 07/18/2024     Lab Results  Component Value Date   TSH 0.94 07/18/2024        Assessment & Plan:   Hypertension:  recently prescribed  Losartan  25 mg daily on  10/02/2024.  Blood pressure prior to starting losartan  was 140-150/96  Blood pressure today is normal at  110/80.   Hyperlipidemia: treated with Atorvastatin  20 mg.       Diabetes Mellitus, Type II: treated with Glipizide  2.5 mg daily with breakfast, Metformin  500 mg twice with meals.    Health maintenance: Had eye exam last month.     I,Makayla C Reid,acting as a scribe for Ronal JINNY Hailstone, MD.,have documented all relevant documentation on the behalf of Ronal JINNY Hailstone, MD,as directed by  Ronal JINNY Hailstone, MD while in the presence of Ronal JINNY Hailstone, MD.      "

## 2024-11-03 ENCOUNTER — Encounter: Payer: Self-pay | Admitting: Internal Medicine

## 2024-11-03 ENCOUNTER — Ambulatory Visit (INDEPENDENT_AMBULATORY_CARE_PROVIDER_SITE_OTHER): Admitting: Internal Medicine

## 2024-11-03 VITALS — BP 110/80 | HR 86 | Ht 62.0 in | Wt 141.0 lb

## 2024-11-03 DIAGNOSIS — Z7984 Long term (current) use of oral hypoglycemic drugs: Secondary | ICD-10-CM

## 2024-11-03 DIAGNOSIS — I10 Essential (primary) hypertension: Secondary | ICD-10-CM | POA: Diagnosis not present

## 2024-11-03 DIAGNOSIS — E1169 Type 2 diabetes mellitus with other specified complication: Secondary | ICD-10-CM | POA: Diagnosis not present

## 2024-11-03 DIAGNOSIS — E785 Hyperlipidemia, unspecified: Secondary | ICD-10-CM | POA: Diagnosis not present

## 2024-11-03 DIAGNOSIS — E119 Type 2 diabetes mellitus without complications: Secondary | ICD-10-CM

## 2024-11-03 NOTE — Patient Instructions (Signed)
 Blood pressure is stable. Follow up in March regarding diabetic control. No change in medications. It was a pleasure to see you today.

## 2025-01-16 ENCOUNTER — Other Ambulatory Visit: Payer: Self-pay

## 2025-01-19 ENCOUNTER — Ambulatory Visit: Payer: Self-pay | Admitting: Internal Medicine
